# Patient Record
Sex: Male | Born: 1995 | Race: White | Hispanic: No | Marital: Married | Smoking: Never smoker
Health system: Southern US, Community
[De-identification: ages and names within clinical notes are randomized; demographics above are authoritative.]

## PROBLEM LIST (undated history)

## (undated) DIAGNOSIS — K589 Irritable bowel syndrome without diarrhea: Secondary | ICD-10-CM

## (undated) DIAGNOSIS — G35 Multiple sclerosis: Secondary | ICD-10-CM

## (undated) HISTORY — PX: TONSILLECTOMY: SUR1361

## (undated) HISTORY — PX: ADENOIDECTOMY: SUR15

## (undated) HISTORY — DX: Multiple sclerosis: G35

## (undated) HISTORY — DX: Irritable bowel syndrome, unspecified: K58.9

---
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
## 2017-03-26 IMAGING — US US SCROTUM
1 series · 14 of 25 positions shown · non-contrast
Comparison: None.

CLINICAL DATA: Nodule in the left scrotum

EXAM:
ULTRASOUND OF SCROTUM
TECHNIQUE: Complete ultrasound examination of the testicles, epididymis, and
other scrotal structures was performed.

[Series 1: us scrotum · 0.07mm/px · 14 of 73 slices shown]
[im 1/73]
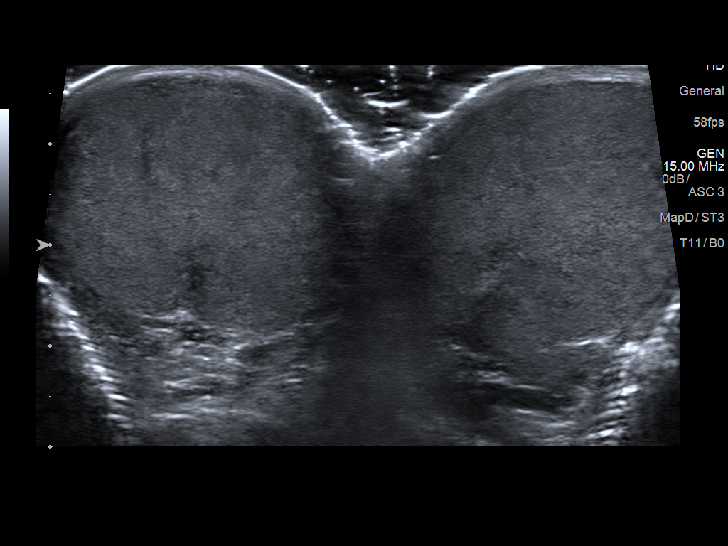
[im 7/73]
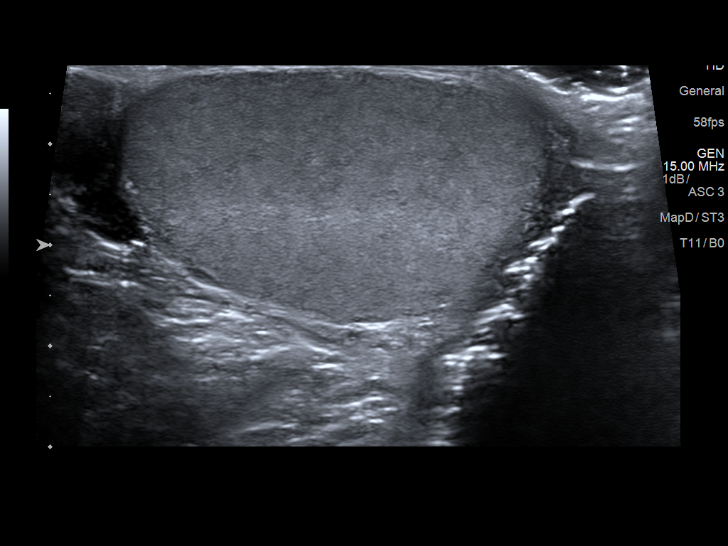
[im 13/73]
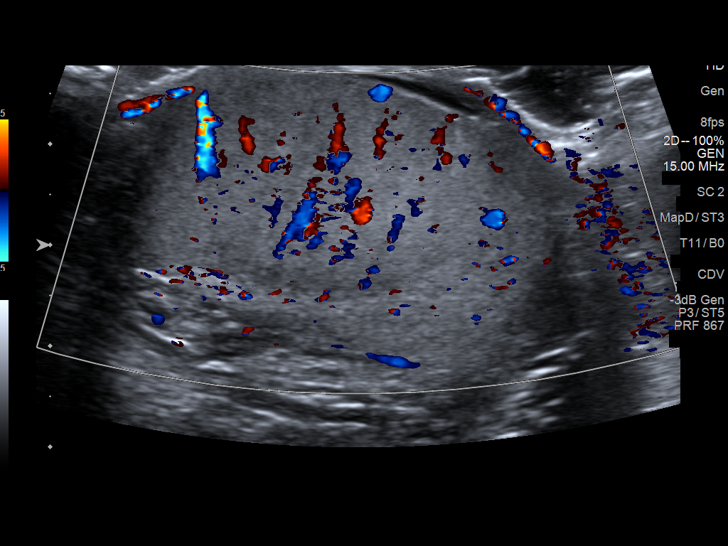
[im 19/73]
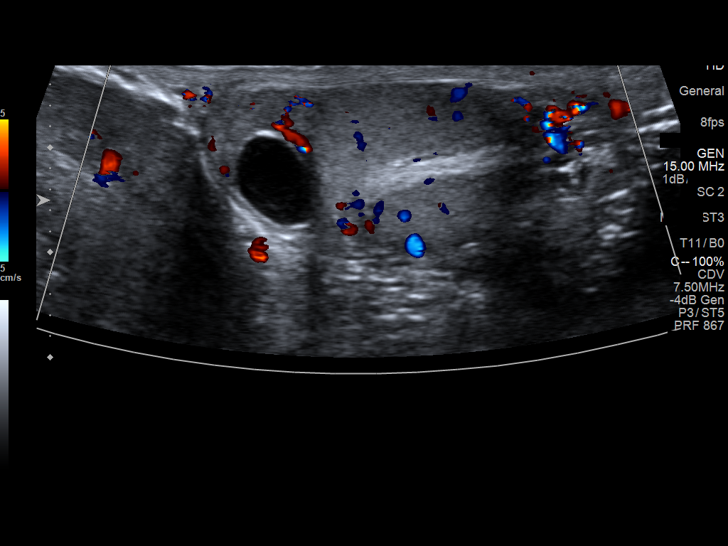
[im 25/73]
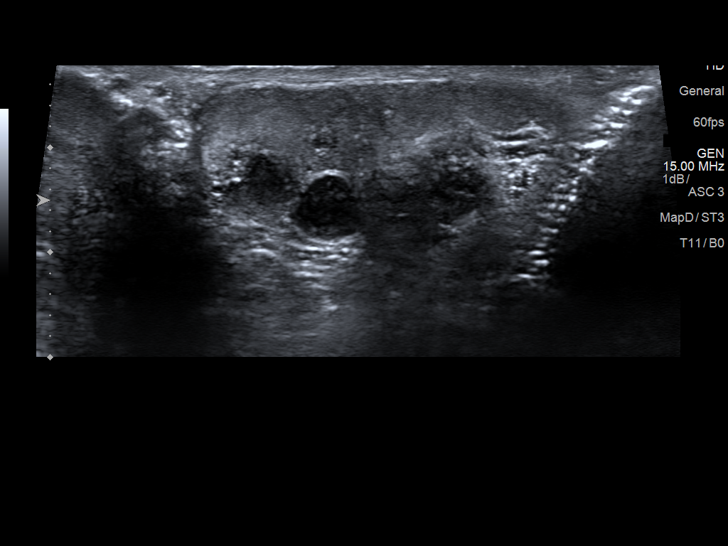
[im 28/73]
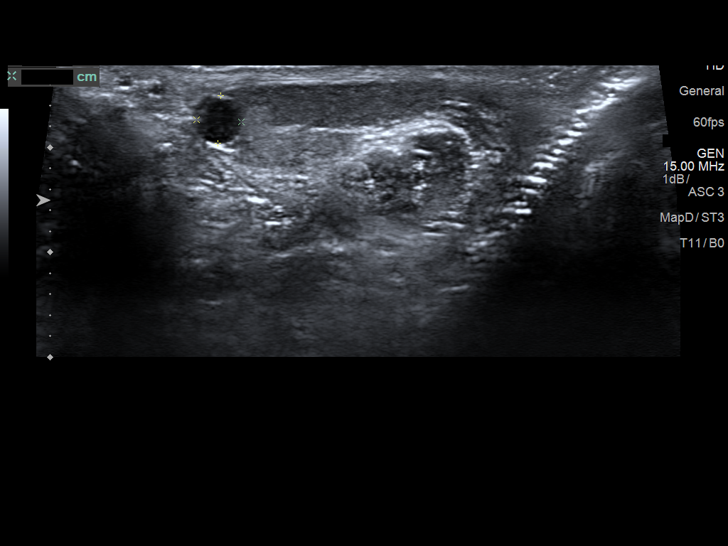
[im 34/73]
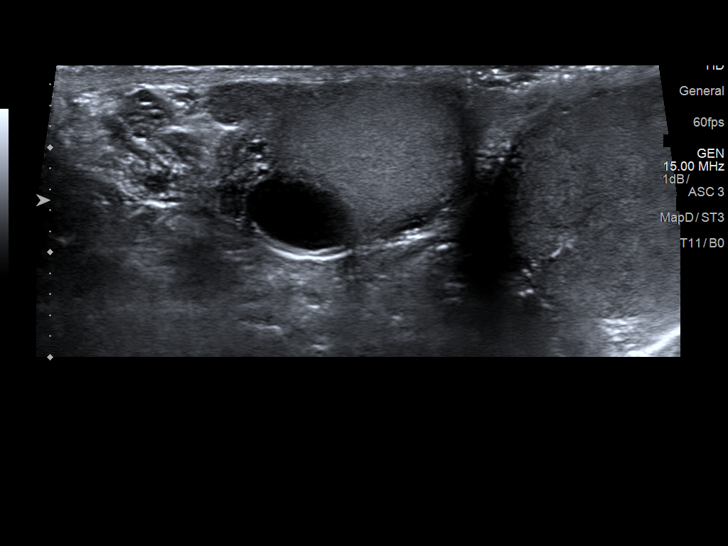
[im 40/73]
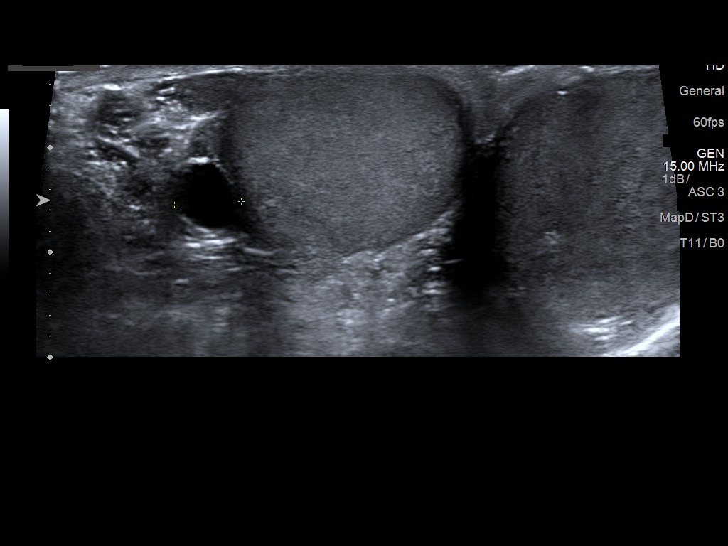
[im 46/73]
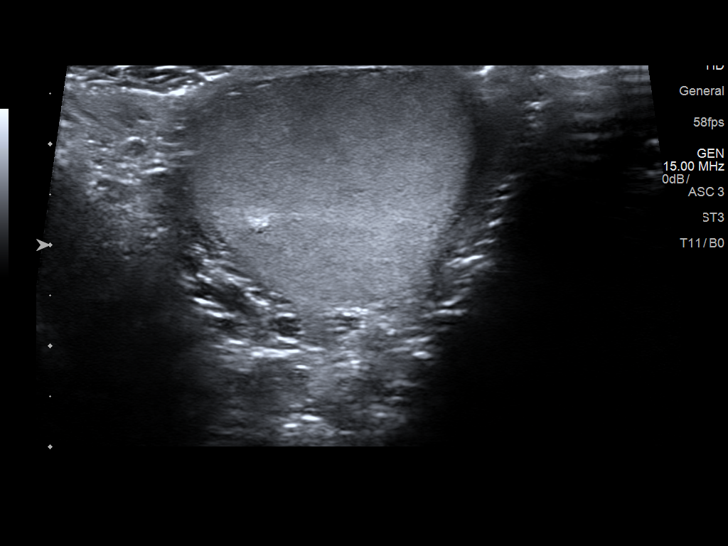
[im 49/73]
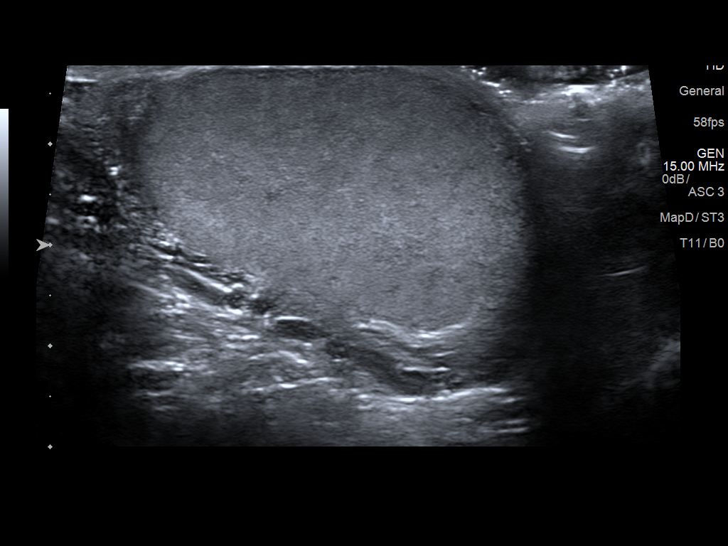
[im 55/73]
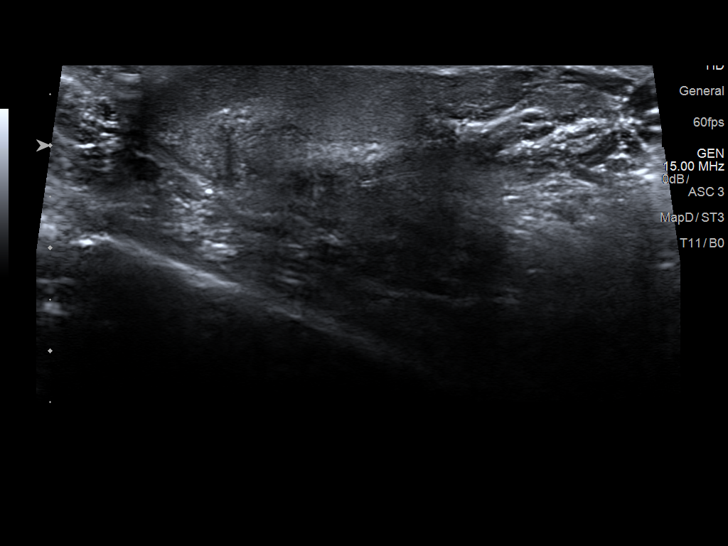
[im 61/73]
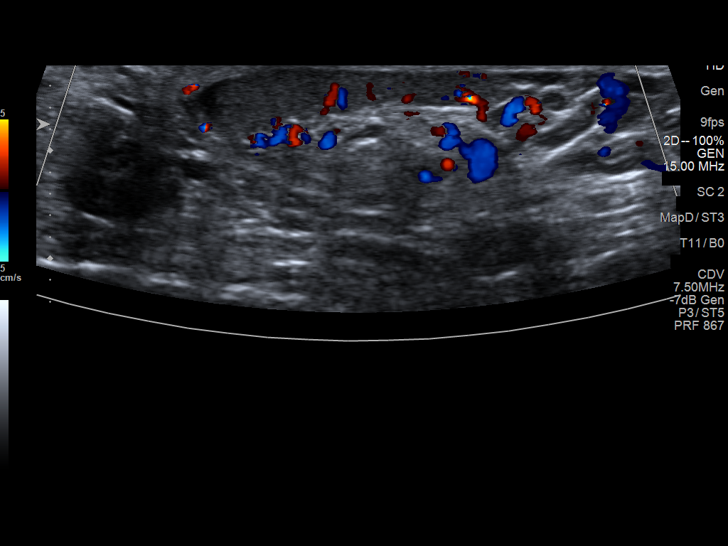
[im 67/73]
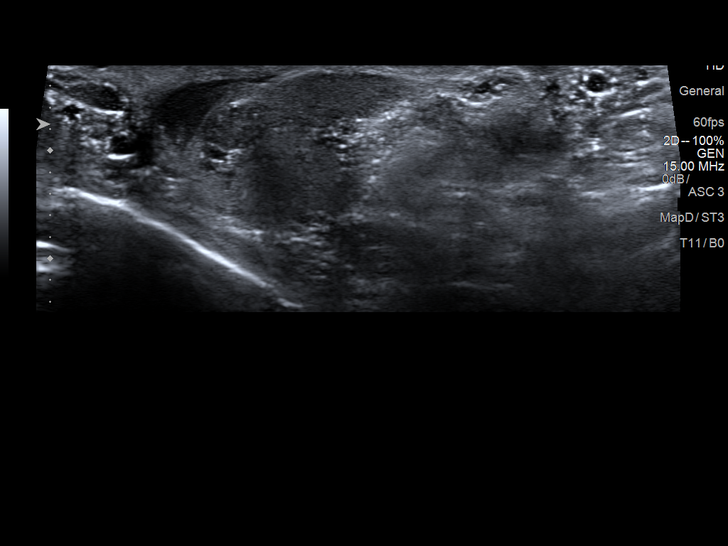
[im 73/73]
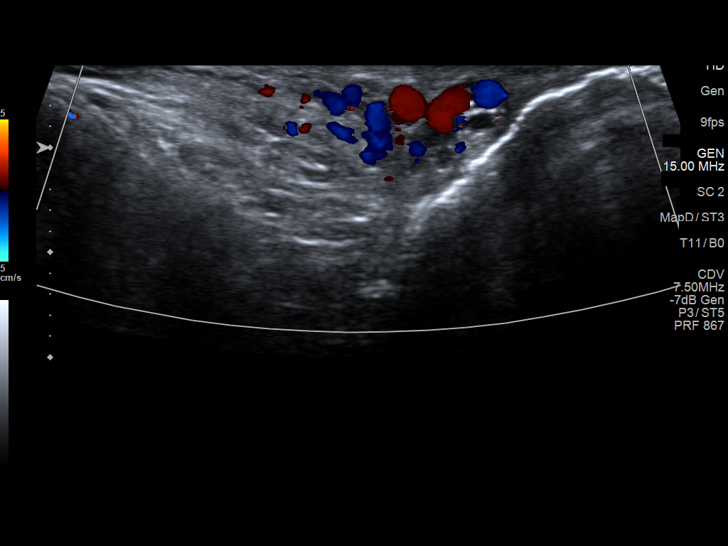

[14 of 25 positions shown; findings below may reference images not displayed]

FINDINGS: Right testicle

Measurements: 4.3 x 2.5 x 2.7 cm. No mass or microlithiasis
visualized.

Left testicle

Measurements: 4.1 x 2.5 x 2.9 cm. No mass or microlithiasis
visualized.

Right epididymis: Multiple cysts, the largest measures 0.9 x 0.7 x 1
cm.

Left epididymis:  Tiny cyst measuring 0.2 cm.

Hydrocele:  None visualized.

Varicocele:  None visualized.
IMPRESSION: 1. Negative for cystic or solid mass within the testes
2. Bilateral right greater than left epididymal cysts

## 2017-06-15 ENCOUNTER — Encounter: Payer: Self-pay | Admitting: Family Medicine

## 2017-06-15 ENCOUNTER — Ambulatory Visit (INDEPENDENT_AMBULATORY_CARE_PROVIDER_SITE_OTHER): Payer: BC Managed Care – PPO | Admitting: Family Medicine

## 2017-06-15 VITALS — BP 124/68 | HR 52 | Temp 98.7°F | Ht 71.25 in | Wt 147.0 lb

## 2017-06-15 DIAGNOSIS — Z0001 Encounter for general adult medical examination with abnormal findings: Secondary | ICD-10-CM | POA: Diagnosis not present

## 2017-06-15 DIAGNOSIS — N503 Cyst of epididymis: Secondary | ICD-10-CM

## 2017-06-15 MED ORDER — HYOSCYAMINE SULFATE 0.125 MG PO TABS: 0.1250 mg | ORAL_TABLET | ORAL | 5 refills | Status: DC | PRN

## 2017-06-15 NOTE — Patient Instructions (Addendum)
I think you are doing very well.   Glad to hear how well things are going in school  Seems like you are striking the right balance between stress/your bowels/hyoscyamine  Next time we see you - get some baseline labs  Have a great weekend!

## 2017-06-15 NOTE — Progress Notes (Addendum)
Phone: [phone]  Subjective:  Patient presents today for their annual physical. Chief complaint-noted.   See problem oriented charting- ROS- full  review of systems was completed and negative including No chest pain or shortness of breath. No headache or blurry vision.   The following were reviewed and entered/updated in epic: Past Medical History:  Diagnosis Date  . IBS (irritable colon syndrome)    Constipation predominant. Constipation- with BM abdominal pain improves. Comes in waves with higher stress. worse with less exercise. Plan: increase exercise, watch FODMAP, consider metamucil if that doesnt work   Patient Active Problem List   Diagnosis Date Noted  . IBS (irritable colon syndrome)     Priority: Medium  . Right leg pain 07/02/2014    Priority: Low   Past Surgical History:  Procedure Laterality Date  . ADENOIDECTOMY     2006  . TONSILLECTOMY      Family History  Problem Relation Age of Onset  . Healthy Mother        and father  . Hyperlipidemia Unknown        grandparent  . Hypertension Unknown        grandparent  . Diabetes Unknown        grandparent  . ADD / ADHD Brother     Medications- reviewed and updated Current Outpatient Prescriptions  Medication Sig Dispense Refill  . hyoscyamine (LEVSIN, ANASPAZ) 0.125 MG tablet Take 1 tablet (0.125 mg total) by mouth every 4 (four) hours as needed (for abdominal cramping. 4 doses a day maximum.). 90 tablet 5   No current facility-administered medications for this visit.     Allergies-reviewed and updated No Known Allergies  Social History   Social History  . Marital status: Single    Spouse name: N/A  . Number of children: N/A  . Years of education: N/A   Social History Main Topics  . Smoking status: Never Smoker  . Smokeless tobacco: Never Used  . Alcohol use No  . Drug use: No  . Sexual activity: Not Asked   Other Topics Concern  . None   Social History Narrative   Family: Single.  Not dating right now.       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 High achieving student      Hobbies: exercise- lifting at gym at least 2-3x a week, video games on xbox and PC, [place] PCP: Nena Polio, [place] in [place]    Objective: BP 124/68 (BP Location: Left Arm, Patient Position: Sitting, Cuff Size: Normal)   Pulse (!) 52   Temp 98.7 F (37.1 C) (Oral)   Ht 5' 11.25" (1.81 m)   Wt 147 lb (66.7 kg)   SpO2 98%   BMI 20.36 kg/m  Gen: NAD, resting comfortably HEENT: Mucous membranes are moist. Oropharynx normal Neck: no thyromegaly CV: RRR no murmurs rubs or gallops Lungs: CTAB no crackles, wheeze, rhonchi Abdomen: soft/nontender/nondistended/normal bowel sounds. No rebound or guarding.  Ext: no edema Skin: warm, dry Neuro: grossly normal, moves all extremities, PERRLA  Assessment/Plan:  21 y.o. male presenting for annual physical.  Health Maintenance counseling: 1. Anticipatory guidance: Patient counseled regarding regular dental exams - q6 months, eye exams - no issues with vision- goes yearly still, wearing seatbelts.  2. Risk factor reduction:  Advised patient of need for regular  exercise and diet rich and fruits and vegetables to reduce risk of heart attack and stroke. Exercise- a few lbs muscle gain with weight lifting. Diet-reasonable for college student.  Wt Readings from Last 3 Encounters:  06/15/17 147 lb (66.7 kg) (38 %, Z= -0.31)*  01/26/16 143 lb (64.9 kg) (40 %, Z= -0.26)*  11/11/15 144 lb (65.3 kg) (43 %, Z= -0.17)*  3. Immunizations/screenings/ancillary studies- already had flu shot Immunization History  Administered Date(s) Administered  . DTaP 01/29/1998, 04/09/1998, 05/27/1998, 02/28/1999, 03/18/2002  . HPV Quadrivalent 03/17/2011, 04/08/2013, 04/16/2014  . Hepatitis A, Ped/Adol-2 Dose 03/17/2011, 04/08/2013  .  Hepatitis B 14-Apr-1998, 12/28/1997, 10/19/1998  . HiB (PRP-T) 01/29/1998, 04/09/1998, 05/27/1998, 02/28/1999  . IPV 01/29/1998, 04/09/1998, 05/27/1998, 03/18/2002  . Influenza-Unspecified 08/03/2004, 07/12/2007, 07/05/2012, 06/07/2013, 06/06/2014, 04/26/2015, 06/27/2015, 05/12/2017  . MMR 12/06/1998, 03/18/2002  . Meningococcal B, OMV 11/11/2015  . Meningococcal Conjugate 01/07/2009, 04/16/2014, 12/13/2015  . Td 01/07/2009  . Tdap 01/07/2009  . Varicella 12/06/1998, 04/08/2013  4. Prostate cancer screening- no family history, start at age 9-55   5. Colon cancer screening - no family history, start at age 71-50 6. Skin cancer screening/prevention- advised regular sunscreen use. Denies worrisome, changing, or new skin lesions. Monitor back - 1 reddish 8 mm x 6 mm lesion- will look at yearly 7. Testicular cancer screening- advised monthly self exams  8. STD screening- patient opts out- abstinent. Has been screened for HIV by giving blood previously  Status of chronic or acute concerns   Screen for hyperlipidemia next year starting age 14. Likely get CBC, CMP, UA as well just for basline  Hyoscyamine 0.125 mg- takes sparingly- helping IBS cramps but takes 2 leads to constipation. Constipation has been hit or miss. Stress can cause it  Addendum: returns as noted nodule above left testicle. Feels like likely epididymal cyst but will get ultrasound to be sure.   Meds ordered this encounter  Medications  . hyoscyamine (LEVSIN, ANASPAZ) 0.125 MG tablet    Sig: Take 1 tablet (0.125 mg total) by mouth every 4 (four) hours as needed (for abdominal cramping. 4 doses a day maximum.).    Dispense:  90 tablet    Refill:  5    Return precautions advised.   Garret Reddish, MD

## 2017-06-17 ENCOUNTER — Encounter: Payer: Self-pay | Admitting: Family Medicine

## 2017-06-18 NOTE — Telephone Encounter (Signed)
Friday afternoon would be perfect- then you would have to come back for the ultrasound if that is needed on another date. Just stop by and Asher Muir can room you- once again do not have to be on the schedule for a visit- I will addend your physical.   [name]

## 2017-06-22 NOTE — Addendum Note (Signed)
Addended by: Shelva Majestic on: 06/22/2017 03:35 PM   Modules accepted: Orders

## 2017-06-26 ENCOUNTER — Telehealth: Payer: Self-pay

## 2017-06-26 NOTE — Telephone Encounter (Signed)
[facility] Calling.  Patient will be having US scrotum.  Radiology wants to know if Dr. Durene Cal wants patient to have Doppler as well?  If yes they need order [id number] placed.  If no, please call back [phone] and let them know.  (If Doppler is wanted, just place order, no need to call back).

## 2017-06-26 NOTE — Telephone Encounter (Signed)
I was not planning on doppler because not concerned about torsion

## 2017-06-27 ENCOUNTER — Telehealth: Payer: Self-pay | Admitting: Family Medicine

## 2017-06-27 NOTE — Telephone Encounter (Signed)
[facility] emailed me to inform that a doppler was needed to be added to [place] orders in for patient. [facility], [id number] is the code they gave me.

## 2017-06-27 NOTE — Telephone Encounter (Signed)
Radiology is aware of annotations.

## 2017-06-28 ENCOUNTER — Other Ambulatory Visit: Payer: Self-pay

## 2017-06-28 DIAGNOSIS — N503 Cyst of epididymis: Secondary | ICD-10-CM

## 2017-06-28 NOTE — Telephone Encounter (Signed)
Order placed as requested.

## 2017-06-29 ENCOUNTER — Ambulatory Visit (HOSPITAL_COMMUNITY)
Admission: RE | Admit: 2017-06-29 | Discharge: 2017-06-29 | Disposition: A | Discharge status: Home / Self Care | Payer: BC Managed Care – PPO | Source: Ambulatory Visit | Attending: Family Medicine | Admitting: Family Medicine

## 2017-06-29 DIAGNOSIS — N503 Cyst of epididymis: Secondary | ICD-10-CM | POA: Insufficient documentation

## 2018-09-19 ENCOUNTER — Encounter: Payer: Self-pay | Admitting: Family Medicine

## 2019-04-24 ENCOUNTER — Encounter: Payer: BC Managed Care – PPO | Admitting: Family Medicine

## 2019-05-09 ENCOUNTER — Encounter: Payer: BC Managed Care – PPO | Admitting: Family Medicine

## 2019-06-05 NOTE — Patient Instructions (Addendum)
Health Maintenance Due  Topic Date Due  . TETANUS/TDAP - today 01/08/2019  . INFLUENZA VACCINE - 2 weeks ago, updated. Thanks for getting ths 04/12/2019    Please stop by lab before you go If you do not have mychart- we will call you about results within 5 business days of [place] receiving them.  If you have mychart- we will send your results within 3 business days of [place] receiving them.  If abnormal or we want to clarify a result, we will call or mychart you to make sure you receive the message.  If you have questions or concerns or don't hear within 5-7 days, please send [place] a message or call us.

## 2019-06-05 NOTE — Progress Notes (Signed)
Phone: [phone]    Subjective:  Patient presents today for their annual physical. Chief complaint-noted.   See problem oriented charting- ROS- full  review of systems was completed and negative  except for: anxiety related to school work  The following were reviewed and entered/updated in epic: Past Medical History:  Diagnosis Date  . IBS (irritable colon syndrome)    Constipation predominant. Constipation- with BM abdominal pain improves. Comes in waves with higher stress. worse with less exercise. Plan: increase exercise, watch FODMAP, consider metamucil if that doesnt work   Patient Active Problem List   Diagnosis Date Noted  . IBS (irritable colon syndrome)     Priority: Medium  . Right leg pain 07/02/2014    Priority: Low   Past Surgical History:  Procedure Laterality Date  . ADENOIDECTOMY     2006  . TONSILLECTOMY      Family History  Problem Relation Age of Onset  . Healthy Mother        and father  . Hyperlipidemia Unknown        grandparent  . Hypertension Unknown        grandparent  . Diabetes Unknown        grandparent  . ADD / ADHD Brother     Medications- reviewed and updated Current Outpatient Medications  Medication Sig Dispense Refill  . hyoscyamine (LEVSIN) 0.125 MG tablet Take 1 tablet (0.125 mg total) by mouth every 4 (four) hours as needed (for abdominal cramping. 4 doses a day maximum.). 90 tablet 5   No current facility-administered medications for this visit.     Allergies-reviewed and updated No Known Allergies  Social History   Social History Narrative   Family: Single. Not dating right now.       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 High achieving student      Hobbies: exercise- lifting at gym at least 2-3x a week, video games on xbox and PC, [place]- Chiropractor      Prior PCP: Nena Polio, FNP [facility] in  [place]      Objective:  BP 120/70 (BP Location: Left Arm, Patient Position: Sitting, Cuff Size: Normal)   Pulse 65   Temp 98.3 F (36.8 C) (Temporal)   Ht 5' 11.25" (1.81 m)   Wt 149 lb (67.6 kg)   SpO2 98%   BMI 20.64 kg/m  Gen: NAD, resting comfortably HEENT: Mucous membranes are moist. Oropharynx normal- no canker sores today Neck: no thyromegaly CV: RRR no murmurs rubs or gallops Lungs: CTAB no crackles, wheeze, rhonchi Abdomen: soft/nontender/nondistended/normal bowel sounds. No rebound or guarding.  Ext: no edema Skin: warm, dry Neuro: grossly normal, moves all extremities, PERRLA    Assessment and Plan:  23 y.o. male presenting for annual physical.  Health Maintenance counseling: 1. Anticipatory guidance: Patient counseled regarding regular dental exams -q6 months, eye exams - no vision issues- considering an updated exam,  avoiding smoking and second hand smoke , limiting alcohol to 2 beverages per day.   2. Risk factor reduction:  Advised patient of need for regular exercise and diet rich and fruits and vegetables to reduce risk of heart attack and stroke. Exercise- likes to bike with GF- a little more sedentary due to online schooling. Diet-reasonably healthy.  Wt Readings from Last 3 Encounters:  06/06/19 149 lb (67.6 kg)  06/15/17 147 lb (66.7 kg) (  38 %, Z= -0.31)*  01/26/16 143 lb (64.9 kg) (40 %, Z= -0.26)*   * Growth percentiles are based on CDC (Boys, 2-20 Years) data.  3. Immunizations/screenings/ancillary studies-recommended Tdap and influenza vaccination today-patient agrees to Tdap but already flu vaccine a few weeks ago.   Immunization History  Administered Date(s) Administered  . DTaP 01/29/1998, 04/09/1998, 05/27/1998, 02/28/1999, 03/18/2002  . HPV Quadrivalent 03/17/2011, 04/08/2013, 04/16/2014  . Hepatitis A, Ped/Adol-2 Dose 03/17/2011, 04/08/2013  . Hepatitis B May 06, 1998, 12/28/1997, 10/19/1998  . HiB (PRP-T) 01/29/1998, 04/09/1998,  05/27/1998, 02/28/1999  . IPV 01/29/1998, 04/09/1998, 05/27/1998, 03/18/2002  . Influenza,inj,Quad PF,6+ Mos 05/23/2019  . Influenza-Unspecified 08/03/2004, 07/12/2007, 07/05/2012, 06/07/2013, 06/06/2014, 04/26/2015, 06/27/2015, 05/12/2017  . MMR 12/06/1998, 03/18/2002  . Meningococcal B, OMV 11/11/2015  . Meningococcal Conjugate 01/07/2009, 04/16/2014, 12/13/2015  . Td 01/07/2009  . Tdap 01/07/2009  . Varicella 12/06/1998, 04/08/2013   4. Prostate cancer screening-   No family history, start at age 34   5. Colon cancer screening -  no family history, start at age 19  6. Skin cancer screening/prevention- advised regular sunscreen use. Denies worrisome, changing, or new skin lesions.  Monitoring back-a reddish 8 x 6 mm lesion- stable this year  7. Testicular cancer screening- advised monthly self exams  8. STD screening- patient opts out-practicing abstinence 9.  Never smoker-   Status of chronic or acute concerns  IBS -reported good first visit here November 11, 2015- had reassuring CT scan in the past-I do not see that we have obtained records.  Worse with periods of high stress.  Symptoms resolved after bowel movement typically.  Taking Hyocyamine 0.125 mg as needed- can constipate him though  Dealing with some anxiety related to school work. He is considering going to a counselor again. He is thinking about trying someone outside of the school this time.   Doing a good job of sleep- at bed by 11 PM. Also trying to take 1 day a week to not do schoolwork  Canker sores- fluocinonide from his dentist- somewhat helpful- discussed decreasing stress. No cold sores/fever blisters.   Recommended follow up: 1 year physical  Lab/Order associations:not fasting- 1 banana   ICD-10-CM   1. Preventative health care  Z00.00 CBC    Comprehensive metabolic panel    Lipid panel  2. Screening for hyperlipidemia  Z13.220 Lipid panel  3. Irritable bowel syndrome with constipation  K58.1 CBC     Comprehensive metabolic panel   Return precautions advised.   Garret Reddish, MD

## 2019-06-06 ENCOUNTER — Other Ambulatory Visit: Payer: Self-pay

## 2019-06-06 ENCOUNTER — Ambulatory Visit (INDEPENDENT_AMBULATORY_CARE_PROVIDER_SITE_OTHER): Payer: BC Managed Care – PPO | Admitting: Family Medicine

## 2019-06-06 ENCOUNTER — Encounter: Payer: Self-pay | Admitting: Family Medicine

## 2019-06-06 VITALS — BP 120/70 | HR 65 | Temp 98.3°F | Ht 71.25 in | Wt 149.0 lb

## 2019-06-06 DIAGNOSIS — Z23 Encounter for immunization: Secondary | ICD-10-CM

## 2019-06-06 DIAGNOSIS — K581 Irritable bowel syndrome with constipation: Secondary | ICD-10-CM

## 2019-06-06 DIAGNOSIS — Z13220 Encounter for screening for lipoid disorders: Secondary | ICD-10-CM | POA: Diagnosis not present

## 2019-06-06 DIAGNOSIS — Z0000 Encounter for general adult medical examination without abnormal findings: Secondary | ICD-10-CM

## 2019-06-06 LAB — LIPID PANEL
Cholesterol: 114 mg/dL (ref 0–200)
HDL: 44.2 mg/dL (ref >39.00)
LDL Cholesterol: 59 mg/dL (ref 0–99)
NonHDL: 69.44
Total CHOL/HDL Ratio: 3
Triglycerides: 50 mg/dL (ref 0.0–149.0)
VLDL: 10 mg/dL (ref 0.0–40.0)

## 2019-06-06 LAB — CBC
HCT: 46.5 % (ref 39.0–52.0)
Hemoglobin: 15.6 g/dL (ref 13.0–17.0)
MCHC: 33.6 g/dL (ref 30.0–36.0)
MCV: 88.3 fl (ref 78.0–100.0)
Platelets: 139 10*3/uL — ABNORMAL LOW (ref 150.0–400.0)
RBC: 5.27 Mil/uL (ref 4.22–5.81)
RDW: 12.8 % (ref 11.5–15.5)
WBC: 4.5 10*3/uL (ref 4.0–10.5)

## 2019-06-06 LAB — COMPREHENSIVE METABOLIC PANEL
ALT: 12 U/L (ref 0–53)
AST: 16 U/L (ref 0–37)
Albumin: 5 g/dL (ref 3.5–5.2)
Alkaline Phosphatase: 59 U/L (ref 39–117)
BUN: 15 mg/dL (ref 6–23)
CO2: 27 mEq/L (ref 19–32)
Calcium: 10 mg/dL (ref 8.4–10.5)
Chloride: 103 mEq/L (ref 96–112)
Creatinine, Ser: 0.9 mg/dL (ref 0.40–1.50)
GFR: 105.99 mL/min (ref >60.00)
Glucose, Bld: 96 mg/dL (ref 70–99)
Potassium: 4.2 mEq/L (ref 3.5–5.1)
Sodium: 139 mEq/L (ref 135–145)
Total Bilirubin: 2.3 mg/dL — ABNORMAL HIGH (ref 0.2–1.2)
Total Protein: 6.9 g/dL (ref 6.0–8.3)

## 2019-06-06 MED ORDER — HYOSCYAMINE SULFATE 0.125 MG PO TABS: 0.1250 mg | ORAL_TABLET | ORAL | 5 refills | Status: DC | PRN

## 2019-06-06 NOTE — Addendum Note (Signed)
Addended by: Jasper Loser on: 06/06/2019 09:52 AM   Modules accepted: Orders

## 2019-06-09 ENCOUNTER — Encounter: Payer: Self-pay | Admitting: Family Medicine

## 2020-05-07 NOTE — Progress Notes (Signed)
Phone [phone] Virtual visit via Video note   Subjective:  Chief complaint: Chief Complaint  Patient presents with  . Dizziness    2 episodes , no syncope   . Abdominal Pain    onset: gradual     This visit type was conducted due to national recommendations for restrictions regarding the COVID-19 Pandemic (e.g. social distancing).  This format is felt to be most appropriate for this patient at this time balancing risks to patient and risks to population by having him in for in person visit.  No physical exam was performed (except for noted visual exam or audio findings with Telehealth visits).    Our team/I connected with Ernestine Conrad Kalbfleisch at 11:00 AM EDT by a video enabled telemedicine application (doxy.me or caregility through epic) and verified that I am speaking with the correct person using two identifiers.  Location patient: [place] Location provider: [facility], office Persons participating in the virtual visit:  patient  Our team/I discussed the limitations of evaluation and management by telemedicine and the availability of in person appointments. In light of current covid-19 pandemic, patient also understands that we are trying to protect them by minimizing in office contact if at all possible.  The patient expressed consent for telemedicine visit and agreed to proceed. Patient understands insurance will be billed.   Past Medical History-  Patient Active Problem List   Diagnosis Date Noted  . IBS (irritable colon syndrome)     Priority: Medium  . Right leg pain 07/02/2014    Priority: Low    Medications- reviewed and updated Current Outpatient Medications  Medication Sig Dispense Refill  . hyoscyamine (LEVSIN) 0.125 MG tablet Take 1 tablet (0.125 mg total) by mouth every 4 (four) hours as needed (for abdominal cramping. 4 doses a day maximum.). 90 tablet 5  . meclizine (ANTIVERT) 12.5 MG tablet Take 1-2 tablets (12.5-25 mg total) by mouth 3 (three) times  daily as needed for dizziness. /vertigo 40 tablet 0   No current facility-administered medications for this visit.     Objective:  no self reported vitals Gen: NAD, resting comfortably Lungs: nonlabored, normal respiratory rate  Skin: appears dry, no obvious rash     Assessment and Plan   # vertigo S:He has been having some bouts of dizziness that lead to nausea. Stated 3 weeks ago right before school sarted. Woke up in the morning and felt like there was fluid in the right ear. That lasted 3-4 days and then got better - perhaps felt slightly off balance in coming weeks. Yesterday woke up with very similar symptoms. Today has improved more rapidly.   With first episode if tilted his head a certain way felt like the room was moving. Occasional slight headache. Has noted perhaps mild blurry vision with running. No hearing loss or ringing in the ears. No reported stroke like symptoms (other than possibly vertigo but that sounds more consistent with BPPV)  Tried taking an antihistamine intermittently. Mom (an NP) recommended taking it daily but has not tried that yet. Is high stress in graduate school.   Tried epley maneuvers first week and not sure how much it helped- he had to stop due to nausea A/P: Patient with vertigo and nausea typically associated with head movement concerning for BPPV.  No tinnitus or hearing loss-doubt Mnire's.  Minimal risk factors for stroke and I think this would be unlikely.  He did not note improvement with Epley maneuvers but that was due to nausea-I gave him  information on the following from YouTube "Alpha Gula, MD Vertigo Treatment Oct 11 [email or web address]".  I want him to try these exercises if he has any recurrence of issues.  Also want him to try meclizine.  If not improving with either of these 100s back out-would consider vestibular rehab  He also feels like these issues have flared his IBS up slightly-asks about using  high-dose cream but it can cause some constipation-recommended first treating BPPV but he can use a hyoscyamine if needed.   Recommended follow up: As needed if symptoms worsen or fail to improve   Lab/Order associations:   ICD-10-CM   1. Benign paroxysmal positional vertigo, unspecified laterality  H81.10   2. Irritable bowel syndrome with constipation  K58.1     Meds ordered this encounter  Medications  . meclizine (ANTIVERT) 12.5 MG tablet    Sig: Take 1-2 tablets (12.5-25 mg total) by mouth 3 (three) times daily as needed for dizziness. Tristan Schroeder    Dispense:  40 tablet    Refill:  0   Return precautions advised.  [name], MD

## 2020-05-08 ENCOUNTER — Telehealth (INDEPENDENT_AMBULATORY_CARE_PROVIDER_SITE_OTHER): Payer: BC Managed Care – PPO | Admitting: Family Medicine

## 2020-05-08 ENCOUNTER — Encounter: Payer: Self-pay | Admitting: Family Medicine

## 2020-05-08 DIAGNOSIS — K581 Irritable bowel syndrome with constipation: Secondary | ICD-10-CM

## 2020-05-08 DIAGNOSIS — H8110 Benign paroxysmal vertigo, unspecified ear: Secondary | ICD-10-CM | POA: Diagnosis not present

## 2020-05-08 MED ORDER — MECLIZINE HCL 12.5 MG PO TABS: 12.5000 mg | ORAL_TABLET | Freq: Three times a day (TID) | ORAL | 0 refills | Status: DC | PRN

## 2020-05-10 ENCOUNTER — Telehealth: Payer: Self-pay | Admitting: Family Medicine

## 2020-05-10 NOTE — Telephone Encounter (Signed)
LVM for patient to give the office a call back

## 2020-05-10 NOTE — Telephone Encounter (Signed)
Please call and schedule pt OV.

## 2020-05-10 NOTE — Telephone Encounter (Signed)
Patient had a visit with Dr.Hunter on Saturday.

## 2020-05-10 NOTE — Telephone Encounter (Signed)
Nurse Assessment Nurse: Kizzie Bane, RN, Marylene Land Date/Time Albert Edwards Time): 05/07/2020 9:44:04 AM Confirm and document reason for call. If symptomatic, describe symptoms. ---Caller states he has had some dizzy spells recently. This morning it happened again with nausea. It comes in waves Has the patient had close contact with a person known or suspected to have the novel coronavirus illness OR traveled / lives in area with major community spread (including international travel) in the last 14 days from the onset of symptoms? * If Asymptomatic, screen for exposure and travel within the last 14 days. ---No Does the patient have any new or worsening symptoms? ---Yes Will a triage be completed? ---Yes Related visit to physician within the last 2 weeks? ---No Does the PT have any chronic conditions? (i.e. diabetes, asthma, this includes High risk factors for pregnancy, etc.) ---No Is this a behavioral health or substance abuse call? ---No Guidelines Guideline Title Affirmed Question Affirmed Notes Nurse Date/Time (Eastern Time) Dizziness - Vertigo [1] MODERATE dizziness (e.g., vertigo; feels very unsteady, interferes with normal activities) AND [2] has NOT been evaluated by physician for this Kizzie Bane, RN, Marylene Land 05/07/2020 9:46:08 AM Disp. Time (Eastern Time) Disposition Final UserPLEASE NOTE: All timestamps contained within this report are represented as [place] Standard Time. CONFIDENTIALTY NOTICE: This fax transmission is intended only for the addressee. It contains information that is legally privileged, confidential or otherwise protected from use or disclosure. If you are not the intended recipient, you are strictly prohibited from reviewing, disclosing, copying using or disseminating any of this information or taking any action in reliance on or regarding this information. If you have received this fax in error, please notify us immediately by telephone so that we can arrange for its return  to [place]. Phone: [phone], Toll-Free: [phone], Fax: [phone] Page: 2 of 2 Call Id: [id number] 05/07/2020 9:49:09 AM See PCP within 24 Hours Yes Kizzie Bane, RN, Rosalyn Charters Disagree/Comply Comply Caller Understands Yes PreDisposition Did not know what to do Care Advice Given Per Guideline SEE PCP WITHIN 24 HOURS: * IF OFFICE WILL BE OPEN: You need to be examined within the next 24 hours. Call your doctor (or NP/PA) when the office opens and make an appointment. CALL BACK IF: * Severe headache occurs * Weakness develops in an arm or leg * Unable to walk without falling * You become worse. CARE ADVICE given per Dizziness - Vertigo (Adult) guideline. Referrals REFERRED TO PCP OFFICE

## 2020-05-23 ENCOUNTER — Encounter: Payer: Self-pay | Admitting: Family Medicine

## 2020-05-23 DIAGNOSIS — H5460 Unqualified visual loss, one eye, unspecified: Secondary | ICD-10-CM

## 2020-05-28 ENCOUNTER — Other Ambulatory Visit: Payer: BC Managed Care – PPO

## 2020-05-28 ENCOUNTER — Ambulatory Visit
Admission: RE | Admit: 2020-05-28 | Discharge: 2020-05-28 | Disposition: A | Discharge status: Home / Self Care | Payer: BC Managed Care – PPO | Source: Ambulatory Visit | Attending: Family Medicine | Admitting: Family Medicine

## 2020-05-28 ENCOUNTER — Encounter: Payer: Self-pay | Admitting: Family Medicine

## 2020-05-28 ENCOUNTER — Encounter (HOSPITAL_COMMUNITY): Payer: Self-pay | Admitting: Emergency Medicine

## 2020-05-28 ENCOUNTER — Other Ambulatory Visit: Payer: Self-pay

## 2020-05-28 ENCOUNTER — Inpatient Hospital Stay (HOSPITAL_COMMUNITY)
Admission: EM | Admit: 2020-05-28 | Discharge: 2020-05-31 | DRG: 060 | Disposition: A | Discharge status: Home / Self Care | Payer: BC Managed Care – PPO | Attending: Internal Medicine | Admitting: Internal Medicine

## 2020-05-28 DIAGNOSIS — G35 Multiple sclerosis: Secondary | ICD-10-CM | POA: Diagnosis not present

## 2020-05-28 DIAGNOSIS — Z79899 Other long term (current) drug therapy: Secondary | ICD-10-CM

## 2020-05-28 DIAGNOSIS — H538 Other visual disturbances: Secondary | ICD-10-CM | POA: Diagnosis present

## 2020-05-28 DIAGNOSIS — H5460 Unqualified visual loss, one eye, unspecified: Secondary | ICD-10-CM

## 2020-05-28 DIAGNOSIS — Z20822 Contact with and (suspected) exposure to covid-19: Secondary | ICD-10-CM | POA: Diagnosis present

## 2020-05-28 DIAGNOSIS — G35A Relapsing-remitting multiple sclerosis: Secondary | ICD-10-CM | POA: Diagnosis present

## 2020-05-28 DIAGNOSIS — K589 Irritable bowel syndrome without diarrhea: Secondary | ICD-10-CM | POA: Diagnosis present

## 2020-05-28 LAB — CBC
HCT: 49.1 % (ref 39.0–52.0)
Hemoglobin: 16.6 g/dL (ref 13.0–17.0)
MCH: 29.4 pg (ref 26.0–34.0)
MCHC: 33.8 g/dL (ref 30.0–36.0)
MCV: 87.1 fL (ref 80.0–100.0)
Platelets: 190 10*3/uL (ref 150–400)
RBC: 5.64 MIL/uL (ref 4.22–5.81)
RDW: 12.3 % (ref 11.5–15.5)
WBC: 6.7 10*3/uL (ref 4.0–10.5)
nRBC: 0 % (ref 0.0–0.2)

## 2020-05-28 LAB — BASIC METABOLIC PANEL
Anion gap: 10 (ref 5–15)
BUN: 13 mg/dL (ref 6–20)
CO2: 27 mmol/L (ref 22–32)
Calcium: 9.9 mg/dL (ref 8.9–10.3)
Chloride: 103 mmol/L (ref 98–111)
Creatinine, Ser: 1.03 mg/dL (ref 0.61–1.24)
GFR calc Af Amer: >60 mL/min (ref >60)
GFR calc non Af Amer: >60 mL/min (ref >60)
Glucose, Bld: 100 mg/dL — ABNORMAL HIGH (ref 70–99)
Potassium: 3.7 mmol/L (ref 3.5–5.1)
Sodium: 140 mmol/L (ref 135–145)

## 2020-05-28 IMAGING — MR MR HEAD WO/W CM
12 series · 48 of 48 positions shown · IV contrast (14ml Multihance)
Comparison: None.

CLINICAL DATA: Monocular vision loss on the right with exertion.

EXAM:
MRI HEAD WITHOUT AND WITH CONTRAST
TECHNIQUE: Multiplanar, multiecho pulse sequences of the brain and surrounding
structures were obtained without and with intravenous contrast.
CONTRAST:  15mL MULTIHANCE GADOBENATE DIMEGLUMINE 529 MG/ML IV SOLN

[Series 5: T1 · sagittal · 4.0mm · 0.94mm/px · 1 of 31 slices shown (1 of 3)]
[im 1/31]
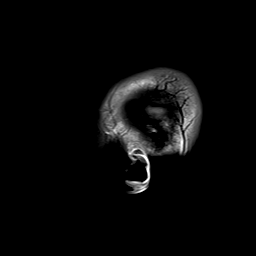

[Series 6: DWI · axial · 3.0mm · 1.44mm/px · z∈[-71,+83]mm · 6 of 96 slices shown (1 of 4)]
[im 1/96]
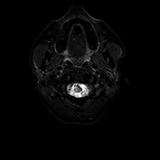
[im 20/96]
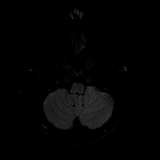
[im 39/96]
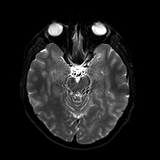
[im 58/96]
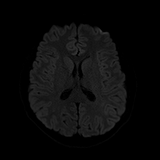
[im 77/96]
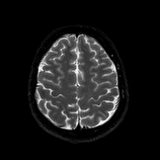
[im 96/96]
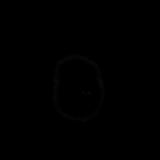

[Series 7: DWI · axial · 3.0mm · 1.44mm/px · z∈[-71,+83]mm · 3 of 48 slices shown (2 of 4)]
[im 1/48]
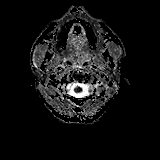
[im 24/48]
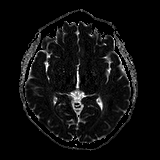
[im 48/48]
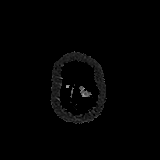

[Series 8: DWI · coronal · 5.0mm · 1.44mm/px · 4 of 66 slices shown (3 of 4)]
[im 1/66]
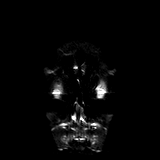
[im 22/66]
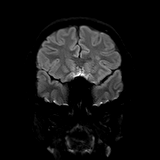
[im 44/66]
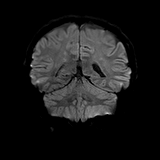
[im 66/66]
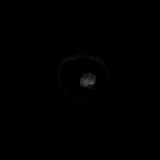

[Series 9: DWI · coronal · 5.0mm · 1.44mm/px · 2 of 33 slices shown (4 of 4)]
[im 1/33]
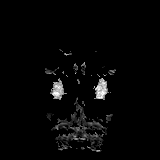
[im 33/33]
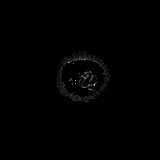

[Series 10: T2 · axial · 4.0mm · 0.36mm/px · z∈[-67,+88]mm · 2 of 31 slices shown]
[im 1/31]
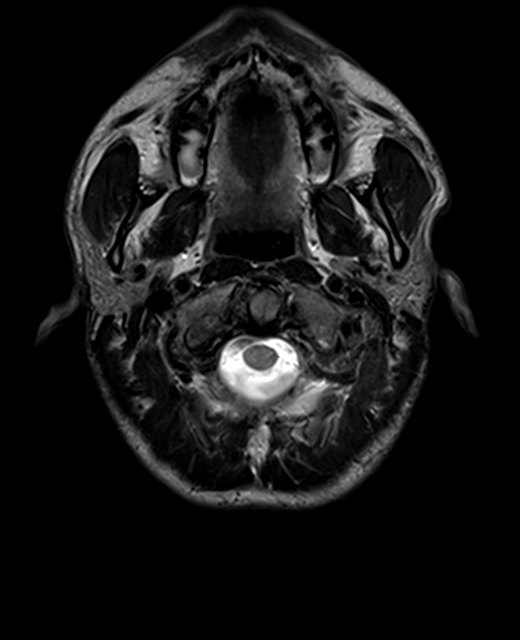
[im 31/31]
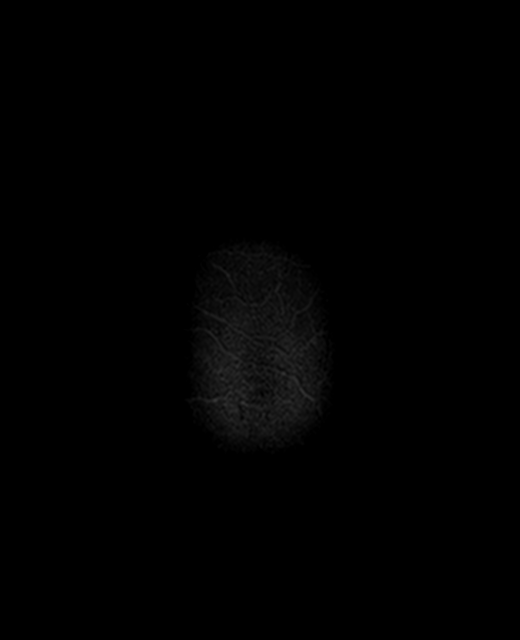

[Series 11: FLAIR · axial · 3.0mm · 0.72mm/px · z∈[-64,+85]mm · 2 of 26 slices shown]
[im 1/26]
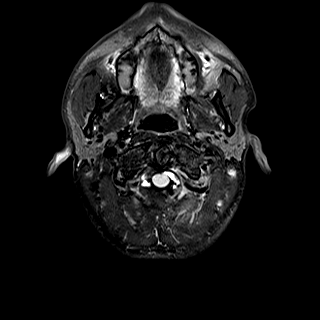
[im 26/26]
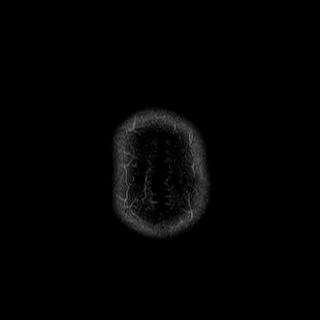

[Series 13: swi_images · axial · 1.5mm · 0.90mm/px · z∈[-60,+81]mm · 6 of 96 slices shown]
[im 1/96]
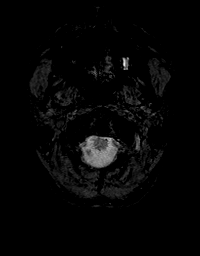
[im 20/96]
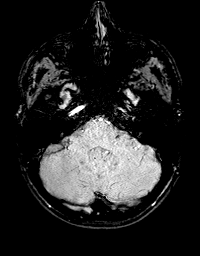
[im 39/96]
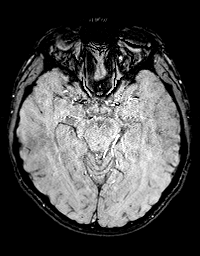
[im 58/96]
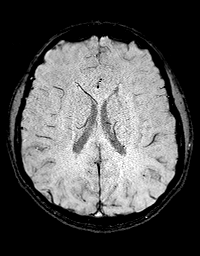
[im 77/96]
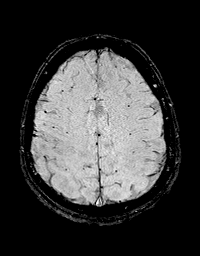
[im 96/96]
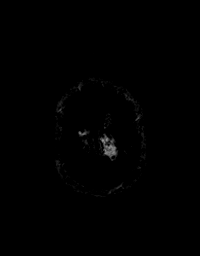

[Series 14: T1 · axial · 1.0mm · 0.94mm/px · z∈[-80,+76]mm · 9 of 160 slices shown (2 of 3)]
[im 1/160]
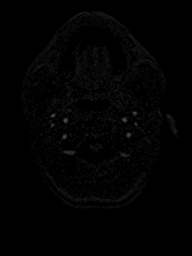
[im 20/160]
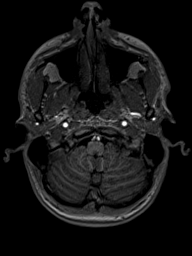
[im 40/160]
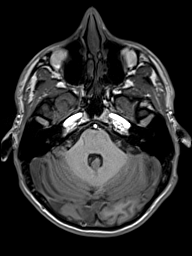
[im 60/160]
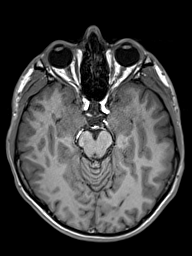
[im 80/160]
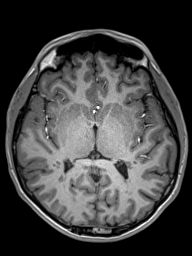
[im 100/160]
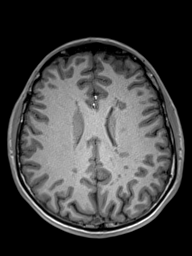
[im 120/160]
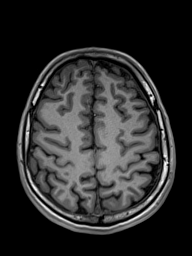
[im 140/160]
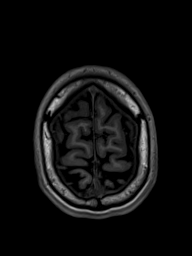
[im 160/160]
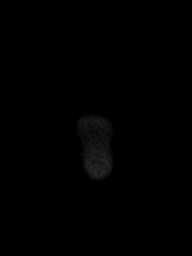

[Series 15: T2 post-contrast · coronal · 4.5mm · 0.36mm/px · 2 of 32 slices shown]
[im 1/32]
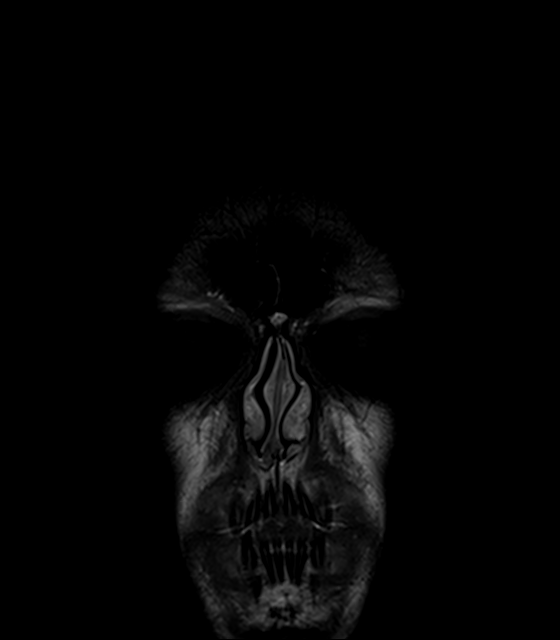
[im 32/32]
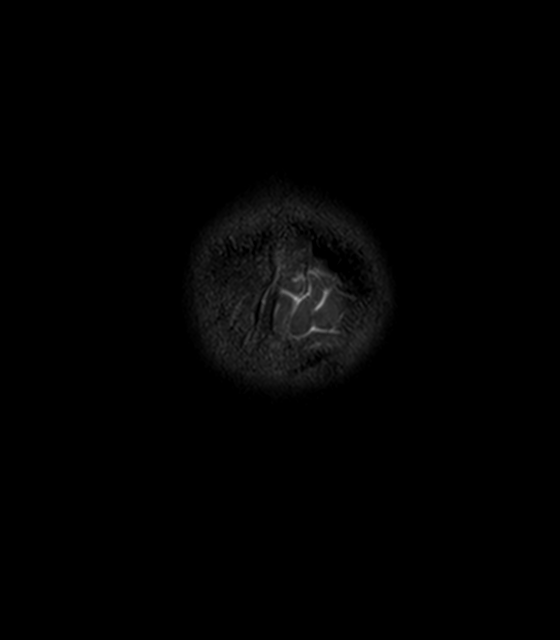

[Series 16: T1 · axial · 1.0mm · 0.94mm/px · z∈[-80,+76]mm · 9 of 160 slices shown (3 of 3)]
[im 1/160]
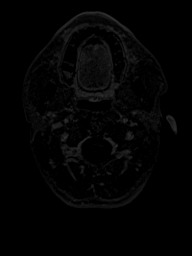
[im 20/160]
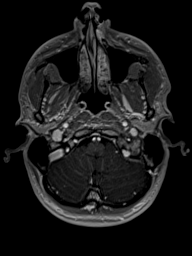
[im 40/160]
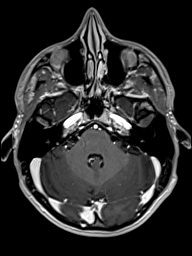
[im 60/160]
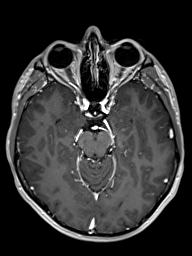
[im 80/160]
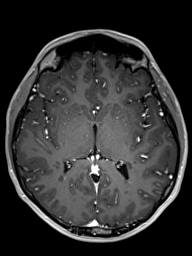
[im 100/160]
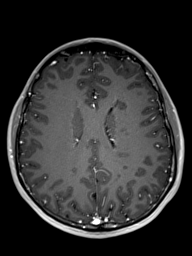
[im 120/160]
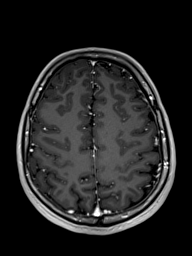
[im 140/160]
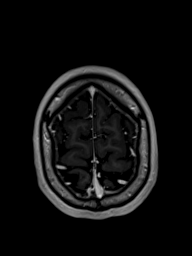
[im 160/160]
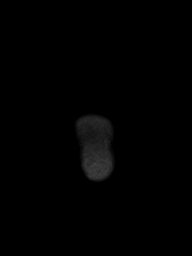

[Series 17: T1 post-contrast · coronal · 4.5mm · 0.72mm/px · 2 of 32 slices shown]
[im 1/32]
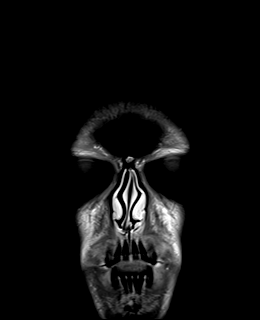
[im 32/32]
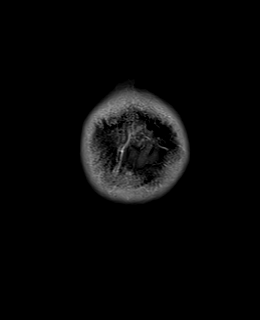

[48 of 48 positions shown; findings below may reference images not displayed]

FINDINGS: Brain: There are numerous foci of abnormal T2 and FLAIR signal
within the hemispheric white matter, with a distribution and
morphology highly suggestive of multiple sclerosis. No lesion is
seen affecting the brainstem, there is minor involvement of the
cerebellum in the region of the left middle cerebellar peduncle.
Cerebral hemispheres show approximately 20-25 lesions within each
hemisphere. None show true restricted diffusion. There is abnormal
enhancement associated with a left posterior frontal deep white
matter lesion. No evidence of mass, hemorrhage, hydrocephalus or
extra-axial collection.

Vascular: Major vessels at the base of the brain show flow.

Skull and upper cervical spine: Negative

Sinuses/Orbits: Sinuses are clear. No orbital pathology is seen.
Dedicated orbital exam was not ordered or performed, but I do not
see evidence of optic neuritis. Both globes appear normal. Orbital
apices appear normal.

Other: None
IMPRESSION: Numerous foci of abnormal T2 and FLAIR signal within the cerebral
hemispheric white matter, with a distribution and morphology highly
suggestive of multiple sclerosis. Single left posterior frontal
white matter lesion shows contrast enhancement, indicating active
disease.

## 2020-05-28 MED ORDER — GADOBENATE DIMEGLUMINE 529 MG/ML IV SOLN
15.0000 mL | Freq: Once | INTRAVENOUS | Status: AC | PRN
  Administered 2020-05-28: 15 mL via INTRAVENOUS

## 2020-05-28 NOTE — ED Triage Notes (Signed)
Pt reports he had an outpatient MRI for vision issues, and he was diagnosed with new onset MS. Pt states he was sent for IV steroids.

## 2020-05-29 ENCOUNTER — Encounter (HOSPITAL_COMMUNITY): Payer: Self-pay | Admitting: Internal Medicine

## 2020-05-29 DIAGNOSIS — K589 Irritable bowel syndrome without diarrhea: Secondary | ICD-10-CM | POA: Diagnosis present

## 2020-05-29 DIAGNOSIS — G35A Relapsing-remitting multiple sclerosis: Secondary | ICD-10-CM | POA: Diagnosis present

## 2020-05-29 DIAGNOSIS — Z20822 Contact with and (suspected) exposure to covid-19: Secondary | ICD-10-CM | POA: Diagnosis present

## 2020-05-29 DIAGNOSIS — G35 Multiple sclerosis: Secondary | ICD-10-CM | POA: Diagnosis present

## 2020-05-29 DIAGNOSIS — Z8719 Personal history of other diseases of the digestive system: Secondary | ICD-10-CM

## 2020-05-29 DIAGNOSIS — Z79899 Other long term (current) drug therapy: Secondary | ICD-10-CM | POA: Diagnosis not present

## 2020-05-29 DIAGNOSIS — H538 Other visual disturbances: Secondary | ICD-10-CM | POA: Diagnosis present

## 2020-05-29 LAB — VITAMIN D 25 HYDROXY (VIT D DEFICIENCY, FRACTURES): Vit D, 25-Hydroxy: 38.47 ng/mL (ref 30–100)

## 2020-05-29 LAB — SARS CORONAVIRUS 2 BY RT PCR (HOSPITAL ORDER, PERFORMED IN ~~LOC~~ HOSPITAL LAB): SARS Coronavirus 2: NEGATIVE

## 2020-05-29 LAB — HIV ANTIBODY (ROUTINE TESTING W REFLEX): HIV Screen 4th Generation wRfx: NONREACTIVE

## 2020-05-29 LAB — VITAMIN B12: Vitamin B-12: 324 pg/mL (ref 180–914)

## 2020-05-29 MED ORDER — ONDANSETRON HCL 4 MG/2ML IJ SOLN: 4.0000 mg | Freq: Four times a day (QID) | INTRAMUSCULAR | Status: DC | PRN

## 2020-05-29 MED ORDER — SODIUM CHLORIDE 0.9 % IV SOLN
1000.0000 mg | Freq: Once | INTRAVENOUS | Status: AC
  Administered 2020-05-29: 1000 mg via INTRAVENOUS
  Filled 2020-05-29: qty 8

## 2020-05-29 MED ORDER — ZOLPIDEM TARTRATE 5 MG PO TABS: 5.0000 mg | ORAL_TABLET | Freq: Every evening | ORAL | Status: DC | PRN

## 2020-05-29 MED ORDER — SODIUM CHLORIDE 0.9 % IV SOLN
1000.0000 mg | INTRAVENOUS | Status: DC
  Filled 2020-05-29: qty 8

## 2020-05-29 MED ORDER — ENOXAPARIN SODIUM 40 MG/0.4ML ~~LOC~~ SOLN
40.0000 mg | Freq: Every day | SUBCUTANEOUS | Status: DC
  Administered 2020-05-29 – 2020-05-30 (×2): 40 mg via SUBCUTANEOUS
  Filled 2020-05-29 (×3): qty 0.4

## 2020-05-29 MED ORDER — ONDANSETRON HCL 4 MG PO TABS: 4.0000 mg | ORAL_TABLET | Freq: Four times a day (QID) | ORAL | Status: DC | PRN

## 2020-05-29 MED ORDER — PANTOPRAZOLE SODIUM 40 MG PO TBEC
40.0000 mg | DELAYED_RELEASE_TABLET | Freq: Every day | ORAL | Status: DC
  Administered 2020-05-29 – 2020-05-31 (×3): 40 mg via ORAL
  Filled 2020-05-29 (×3): qty 1

## 2020-05-29 MED ORDER — ALBUTEROL SULFATE (2.5 MG/3ML) 0.083% IN NEBU: 2.5000 mg | INHALATION_SOLUTION | Freq: Four times a day (QID) | RESPIRATORY_TRACT | Status: DC | PRN

## 2020-05-29 MED ORDER — ACETAMINOPHEN 325 MG PO TABS: 650.0000 mg | ORAL_TABLET | Freq: Four times a day (QID) | ORAL | Status: DC | PRN

## 2020-05-29 MED ORDER — ACETAMINOPHEN 650 MG RE SUPP: 650.0000 mg | Freq: Four times a day (QID) | RECTAL | Status: DC | PRN

## 2020-05-29 MED ORDER — SODIUM CHLORIDE 0.9% FLUSH
3.0000 mL | Freq: Two times a day (BID) | INTRAVENOUS | Status: DC
  Administered 2020-05-29 – 2020-05-30 (×4): 3 mL via INTRAVENOUS

## 2020-05-29 MED ORDER — LORATADINE 10 MG PO TABS
10.0000 mg | ORAL_TABLET | Freq: Every day | ORAL | Status: DC
  Administered 2020-05-29 – 2020-05-31 (×3): 10 mg via ORAL
  Filled 2020-05-29 (×4): qty 1

## 2020-05-29 MED ORDER — SODIUM CHLORIDE 0.9 % IV SOLN
1000.0000 mg | INTRAVENOUS | Status: AC
  Administered 2020-05-30 – 2020-05-31 (×2): 1000 mg via INTRAVENOUS
  Filled 2020-05-29 (×2): qty 8

## 2020-05-29 NOTE — Consult Note (Signed)
NEURO HOSPITALIST CONSULT NOTE   Requestig physician: Ralph Leyden PA   Reason for Consult:  Multiple Sclerosis    History obtained from:  Patient and Chart    HPI:                                                                                                                                          Albert Edwards is an 24 y.o. male presenting after outpatient MRI identified Multiple Sclerosis. The patient reports that over the past month he has experienced episodes of dizziness and blurry vision in his right eye. He reports that prior to this month he has never had dizziness, blurred vision, loss of vision, numbness, tingling or weakness. Only past medical history is IBS, which he notices comes on when he is stressed at school.   [place] graduated this past May with his undergraduate degree and is currently attending [employer] for a Master's Degree in Engineering. He is engaged to be married and shares that his fiancee works in Photographer. He has fraternal twin brother and reports his twin brother has never had similar symptoms of dizziness or blurred vision. He has two additional brothers. His mother is at bedside and she is a family Publishing rights manager. The patient and his mother report no family history of autoimmune diseases. They do report a significant history of B12 deficiency in their family, his father developed numbness in his hands and had to stop working as a Education officer, community for a period of time.   He reports that his symptoms first started in the first week of August. He felt dizzy as if the room was spinning for 3-4 days and the symptoms were worse if he tilted his head backwards. He did not see a medical provider during that time. Then on 05/07/20 he had one day of dizziness accompanied by nausea. He said it was a general feeling of dizziness, not as if the room was spinning and not made worse by tilting his head backwards.    Over the past month he has experienced  blurred vision in his right eye with activity. He says if he is running or even on a brisk walk that his right eye will become extremely blurry to the point he can barely see out of it. He describes it as a blurry lens right over the center of his eye. He has not noticed the blurriness with hot showers. He does not remember every having vision trouble prior to this month. He is sure it is only the right eye and shares has covered his right eye and been able to see clearly out of the left during episodes.   Past Medical History:  Diagnosis Date  . IBS (irritable colon syndrome)    Constipation predominant. Constipation- with  BM abdominal pain improves. Comes in waves with higher stress. worse with less exercise. Plan: increase exercise, watch FODMAP, consider metamucil if that doesnt work    Past Surgical History:  Procedure Laterality Date  . ADENOIDECTOMY     2006  . TONSILLECTOMY      Family History  Problem Relation Age of Onset  . Healthy Mother        and father  . Hyperlipidemia Other        grandparent  . Hypertension Other        grandparent  . Diabetes Other        grandparent  . ADD / ADHD Brother               Family History: Significant B12 deficiency causing hand numbness in his father    Social History:  reports that he has never smoked. He has never used smokeless tobacco. He reports that he does not drink alcohol and does not use drugs.  No Known Allergies  MEDICATIONS:                                                                                                                     I have reviewed the patient's current medications.   ROS:                                                                                                                                       History obtained from the patient  General ROS: negative for - chills, fatigue, fever, night sweats, weight gain or weight loss Psychological ROS: negative for - behavioral disorder,  hallucinations, memory difficulties, mood swings or suicidal ideation Ophthalmic ROS: negative for -  double vision, eye pain or loss of vision. Positive for blurry vision, see HPI ENT ROS: negative for - epistaxis, nasal discharge, oral lesions, sore throat, tinnitus. Positive for vertigo, see HPI Allergy and Immunology ROS: negative for - hives or itchy/watery eyes Hematological and Lymphatic ROS: negative for - bleeding problems, bruising or swollen lymph nodes Endocrine ROS: negative for -  temperature intolerance Respiratory ROS: negative for - cough, hemoptysis, shortness of breath or wheezing Cardiovascular ROS: negative for - chest pain, dyspnea on exertion, edema or irregular heartbeat Gastrointestinal ROS: negative for - abdominal pain, diarrhea, hematemesis, vomiting or stool incontinence. Positive for nausea, see HPI Genito-Urinary ROS: negative for - dysuria, hematuria, incontinence or urinary frequency/urgency Musculoskeletal  ROS: negative for - joint swelling or muscular weakness Neurological ROS: as noted in HPI Dermatological ROS: negative for rash and skin lesion changes   Blood pressure 128/64, pulse 73, temperature 98.6 F (37 C), temperature source Oral, resp. rate 16, height 5\' 11"  (1.803 m), weight 65.8 kg, SpO2 98 %.   General Examination:                                                                                                       Physical Exam  HEENT-  Normocephalic, no lesions, without obvious abnormality.  Normal external eye and conjunctiva.   Cardiovascular-  pulses palpable throughout   Lungs- no excessive working breathing.  Saturations within normal limits Abdomen- All 4 quadrants palpated and nontender Extremities- Warm, dry and intact Musculoskeletal-no joint tenderness, deformity or swelling Skin-warm and dry, no hyperpigmentation, vitiligo, or suspicious lesions  Neurological Examination Mental Status: Alert, oriented, thought content  appropriate.  Speech fluent without evidence of aphasia.  Able to follow 3 step commands without difficulty. Able to say how many quarters in 2.75. Able to spell WORLD frontwards and backwards.  Cranial Nerves: II:  Visual fields grossly normal,  III,IV, VI: ptosis not present, extra-ocular motions intact bilaterally pupils equal, round, reactive to light and accommodation. A few beats of horizontal gaze nystagmus noted bilaterally  V,VII: smile symmetric, facial light touch sensation normal bilaterally VIII: hearing normal bilaterally IX,X: uvula rises symmetrically XI: bilateral shoulder shrug XII: midline tongue extension Motor: Right : Upper extremity   5/5    Left:     Upper extremity   5/5  Lower extremity   5/5     Lower extremity   5/5 Tone and bulk:normal tone throughout; no atrophy noted Sensory:  light touch intact throughout, bilaterally Cerebellar: normal finger-to-nose, normal rapid alternating movements and normal heel-to-shin test  No red desaturation identified (tested holding up a red pen and having him cover one eye and the the other back and forth)   Lab Results: Basic Metabolic Panel: Recent Labs  Lab 05/28/20 2230  NA 140  K 3.7  CL 103  CO2 27  GLUCOSE 100*  BUN 13  CREATININE 1.03  CALCIUM 9.9    CBC: Recent Labs  Lab 05/28/20 2230  WBC 6.7  HGB 16.6  HCT 49.1  MCV 87.1  PLT 190    Cardiac Enzymes: No results for input(s): CKTOTAL, CKMB, CKMBINDEX, TROPONINI in the last 168 hours.  Lipid Panel: No results for input(s): CHOL, TRIG, HDL, CHOLHDL, VLDL, LDLCALC in the last 168 hours.  Imaging: MR Brain W Wo Contrast  Result Date: 05/28/2020 CLINICAL DATA:  Monocular vision loss on the right with exertion. EXAM: MRI HEAD WITHOUT AND WITH CONTRAST TECHNIQUE: Multiplanar, multiecho pulse sequences of the brain and surrounding structures were obtained without and with intravenous contrast. CONTRAST:  42mL MULTIHANCE GADOBENATE DIMEGLUMINE 529  MG/ML IV SOLN COMPARISON:  None. FINDINGS: Brain: There are numerous foci of abnormal T2 and FLAIR signal within the hemispheric white matter, with a distribution and morphology highly suggestive of multiple sclerosis. No lesion  is seen affecting the brainstem, there is minor involvement of the cerebellum in the region of the left middle cerebellar peduncle. Cerebral hemispheres show approximately 20-25 lesions within each hemisphere. None show true restricted diffusion. There is abnormal enhancement associated with a left posterior frontal deep white matter lesion. No evidence of mass, hemorrhage, hydrocephalus or extra-axial collection. Vascular: Major vessels at the base of the brain show flow. Skull and upper cervical spine: Negative Sinuses/Orbits: Sinuses are clear. No orbital pathology is seen. Dedicated orbital exam was not ordered or performed, but I do not see evidence of optic neuritis. Both globes appear normal. Orbital apices appear normal. Other: None IMPRESSION: Numerous foci of abnormal T2 and FLAIR signal within the cerebral hemispheric white matter, with a distribution and morphology highly suggestive of multiple sclerosis. Single left posterior frontal white matter lesion shows contrast enhancement, indicating active disease. Electronically Signed   By: Paulina Fusi M.D.   On: 05/28/2020 14:19   Assessment:  Albert Edwards is an 24 y.o. male presenting after outpatient MRI identified Multiple Sclerosis. He is very active and healthy baseline. Past medical history of IBS that is most active when he is stressed at school. He is currently attending [employer] for a Masters Degree in Engineering. Over the past month he had two episodes of dizziness, one lasting 3-4 days and one lasting 1 day. He has also experienced blurred vision out of his right eye when he is briskly walking or jogging. Outpatient Brain MRI consistent with multiple sclerosis     MRI Brain IMPRESSION:Numerous foci of  abnormal T2 and FLAIR signal within the cerebral hemispheric white matter, with a distribution and morphology highly suggestive of multiple sclerosis. Single left posterior frontal white matter lesion shows contrast enhancement, indicating active disease.  The patient was most interested in the prognosis of MS. Dr. Amada Jupiter shared the typical relapsing and remitting nature of MS flares and discussed that many treatment options are available to reduce the number of flares. Dr. Amada Jupiter explained that many people with multiple sclerosis go on to live very normal and productive lives. However it is possible over time that may develop some deficits, and that amount of disability, not shortened lifespan, is of greatest concern in multiple sclerosis.   Impression: Multiple Sclerosis: New Diagnosis   Recommendations: 1. New Diagnosis of Multiple Sclerosis. IV Solumedrol x 3 days. First dose given today. Next doses on Sunday and Monday.  2. Cervical and Thoracic spine MRI with and without contrast to evaluate new Multiple Sclerosis Diagnosis. This will be important as a baseline and to compare for future events.  3. The patient is currently attending school at [employer] in [place], however his parents live in [place] and he prefers to see a neurologist in [place]. Referral to be placed to Dr. Despina Arias with [facility] to discuss MS treatment options.   Cathrine Muster DNP, FNP-C [facility] Nurse Practitioner  Note review and additional recommendations to follow from attending neurologist.   05/29/2020, 11:39 AM

## 2020-05-29 NOTE — ED Provider Notes (Addendum)
[facility] EMERGENCY DEPARTMENT Provider Note   CSN: [id number] Arrival date & time: 05/28/20  2121    History Chief Complaint  Patient presents with  . Eye Problem    Albert Edwards is a 24 y.o. male with past medical history significant for IBS who presents for evaluation of abnormal MRI.  Patient states he has been having intermittent bouts of dizziness over the last 2 to 3 weeks.  Initially described as room moving however started progressing.  Has felt slightly off balance.  Patient states that he has had intermittent blurred vision in his right eye which occurs primarily with exercise however mother who is an NP states occurs whenever his heart rate goes up.  He was given meclizine by his PCP.  Had an MRI outpatient with and without contrast which showed abnormal signal enhancement suggestive of active MS.  He has no prior history of MS.  He has no paresthesias.  No diplopia.  He denies any current headache, lightheadedness, dizziness, weakness, paresthesias, facial droop, difficulty with word finding, chest pain, shortness of breath abdominal pain.  Denies additional aggravating or alleviating factors.  History obtained from patient and past medical records.  No interpreter is used.  HPI     Past Medical History:  Diagnosis Date  . IBS (irritable colon syndrome)    Constipation predominant. Constipation- with BM abdominal pain improves. Comes in waves with higher stress. worse with less exercise. Plan: increase exercise, watch FODMAP, consider metamucil if that doesnt work    Patient Active Problem List   Diagnosis Date Noted  . Multiple sclerosis exacerbation (HCC) 05/29/2020  . IBS (irritable colon syndrome)   . Right leg pain 07/02/2014    Past Surgical History:  Procedure Laterality Date  . ADENOIDECTOMY     2006  . TONSILLECTOMY         Family History  Problem Relation Age of Onset  . Healthy Mother        and father  .  Hyperlipidemia Other        grandparent  . Hypertension Other        grandparent  . Diabetes Other        grandparent  . ADD / ADHD Brother     Social History   Tobacco Use  . Smoking status: Never Smoker  . Smokeless tobacco: Never Used  Substance Use Topics  . Alcohol use: No    Alcohol/week: 0.0 standard drinks  . Drug use: No    Home Medications Prior to Admission medications   Medication Sig Start Date End Date Taking? Authorizing Provider  acetaminophen (TYLENOL) 500 MG tablet Take 500 mg by mouth every 6 (six) hours as needed for moderate pain.   Yes [provider]  fexofenadine (ALLEGRA) 180 MG tablet Take 180 mg by mouth daily.   Yes [provider]  meclizine (ANTIVERT) 12.5 MG tablet Take 1-2 tablets (12.5-25 mg total) by mouth 3 (three) times daily as needed for dizziness. Tristan Schroeder 05/08/20  Yes Shelva Majestic, MD  hyoscyamine (LEVSIN) 0.125 MG tablet Take 1 tablet (0.125 mg total) by mouth every 4 (four) hours as needed (for abdominal cramping. 4 doses a day maximum.). Patient not taking: Reported on 05/29/2020 06/06/19   Shelva Majestic, MD    Allergies    Patient has no known allergies.  Review of Systems   Review of Systems  Constitutional: Negative.   HENT: Negative.   Eyes: Positive for visual disturbance. Negative for  photophobia, pain, discharge, redness and itching.  Respiratory: Negative.   Cardiovascular: Negative.   Gastrointestinal: Negative.   Genitourinary: Negative.   Musculoskeletal: Negative.   Skin: Negative.   Neurological: Negative.   All other systems reviewed and are negative.  Physical Exam Updated Vital Signs BP 128/64   Pulse 73   Temp 98.6 F (37 C) (Oral)   Resp 16   Ht 5\' 11"  (1.803 m)   Wt 65.8 kg   SpO2 98%   BMI 20.22 kg/m   Physical Exam Physical Exam  Constitutional: Pt is oriented to person, place, and time. Pt appears well-developed and well-nourished. No distress.  HENT:  Head:  Normocephalic and atraumatic.  Mouth/Throat: Oropharynx is clear and moist.  Eyes: Conjunctivae and EOM are normal. Pupils are equal, round, and reactive to light. No scleral icterus.  No vertical or rotational nystagmus. Possible end gaze nystagmus on right. Neck: Normal range of motion. Neck supple.  Full active and passive ROM without pain No midline or paraspinal tenderness No nuchal rigidity or meningeal signs  Cardiovascular: Normal rate, regular rhythm and intact distal pulses.   Pulmonary/Chest: Effort normal and breath sounds normal. No respiratory distress. Pt has no wheezes. No rales.  Abdominal: Soft. Bowel sounds are normal. There is no tenderness. There is no rebound and no guarding.  Musculoskeletal: Normal range of motion.  Lymphadenopathy:    No cervical adenopathy.  Neurological: Pt. is alert and oriented to person, place, and time. He has normal reflexes. No cranial nerve deficit.  Exhibits normal muscle tone. Coordination normal.  Mental Status:  Alert, oriented, thought content appropriate. Speech fluent without evidence of aphasia. Able to follow 2 step commands without difficulty.  Cranial Nerves:  II:  Peripheral visual fields grossly normal, pupils equal, round, reactive to light III,IV, VI: ptosis not present, extra-ocular motions intact bilaterally  V,VII: smile symmetric, facial light touch sensation equal VIII: hearing grossly normal bilaterally  IX,X: midline uvula rise  XI: bilateral shoulder shrug equal and strong XII: midline tongue extension  Motor:  5/5 in upper and lower extremities bilaterally including strong and equal grip strength and dorsiflexion/plantar flexion Sensory: Pinprick and light touch normal in all extremities.  Deep Tendon Reflexes: 2+ and symmetric  Cerebellar: normal finger-to-nose with bilateral upper extremities Gait: normal gait and balance CV: distal pulses palpable throughout   Skin: Skin is warm and dry. No rash noted. Pt is  not diaphoretic.  Psychiatric: Pt has a normal mood and affect. Behavior is normal. Judgment and thought content normal.  Nursing note and vitals reviewed. ED Results / Procedures / Treatments   Labs (all labs ordered are listed, but only abnormal results are displayed) Labs Reviewed  BASIC METABOLIC PANEL - Abnormal; Notable for the following components:      Result Value   Glucose, Bld 100 (*)    All other components within normal limits  SARS CORONAVIRUS 2 BY RT PCR (HOSPITAL ORDER, PERFORMED IN [facility])  CBC  HIV ANTIBODY (ROUTINE TESTING W REFLEX)  VITAMIN D 25 HYDROXY (VIT D DEFICIENCY, FRACTURES)  VITAMIN B12    EKG None  Radiology MR Brain W Wo Contrast  Result Date: 05/28/2020 CLINICAL DATA:  Monocular vision loss on the right with exertion. EXAM: MRI HEAD WITHOUT AND WITH CONTRAST TECHNIQUE: Multiplanar, multiecho pulse sequences of the brain and surrounding structures were obtained without and with intravenous contrast. CONTRAST:  72mL MULTIHANCE GADOBENATE DIMEGLUMINE 529 MG/ML IV SOLN COMPARISON:  None. FINDINGS: Brain: There are  numerous foci of abnormal T2 and FLAIR signal within the hemispheric white matter, with a distribution and morphology highly suggestive of multiple sclerosis. No lesion is seen affecting the brainstem, there is minor involvement of the cerebellum in the region of the left middle cerebellar peduncle. Cerebral hemispheres show approximately 20-25 lesions within each hemisphere. None show true restricted diffusion. There is abnormal enhancement associated with a left posterior frontal deep white matter lesion. No evidence of mass, hemorrhage, hydrocephalus or extra-axial collection. Vascular: Major vessels at the base of the brain show flow. Skull and upper cervical spine: Negative Sinuses/Orbits: Sinuses are clear. No orbital pathology is seen. Dedicated orbital exam was not ordered or performed, but I do not see evidence of optic  neuritis. Both globes appear normal. Orbital apices appear normal. Other: None IMPRESSION: Numerous foci of abnormal T2 and FLAIR signal within the cerebral hemispheric white matter, with a distribution and morphology highly suggestive of multiple sclerosis. Single left posterior frontal white matter lesion shows contrast enhancement, indicating active disease. Electronically Signed   By: Paulina Fusi M.D.   On: 05/28/2020 14:19   Procedures Procedures (including critical care time)  Medications Ordered in ED Medications  loratadine (CLARITIN) tablet 10 mg (has no administration in time range)  enoxaparin (LOVENOX) injection 40 mg (has no administration in time range)  pantoprazole (PROTONIX) EC tablet 40 mg (has no administration in time range)  sodium chloride flush (NS) 0.9 % injection 3 mL (3 mLs Intravenous Given 05/29/20 1146)  acetaminophen (TYLENOL) tablet 650 mg (has no administration in time range)    Or  acetaminophen (TYLENOL) suppository 650 mg (has no administration in time range)  ondansetron (ZOFRAN) tablet 4 mg (has no administration in time range)    Or  ondansetron (ZOFRAN) injection 4 mg (has no administration in time range)  albuterol (PROVENTIL) (2.5 MG/3ML) 0.083% nebulizer solution 2.5 mg (has no administration in time range)  methylPREDNISolone sodium succinate (SOLU-MEDROL) 1,000 mg in sodium chloride 0.9 % 50 mL IVPB (has no administration in time range)  methylPREDNISolone sodium succinate (SOLU-MEDROL) 1,000 mg in sodium chloride 0.9 % 50 mL IVPB (0 mg Intravenous Stopped 05/29/20 1147)    ED Course  I have reviewed the triage vital signs and the nursing notes.  Pertinent labs & imaging results that were available during my care of the patient were reviewed by me and considered in my medical decision making (see chart for details).  24 year old otherwise healthy male presents for evaluation of intermittent blurred vision in his right eye as well as dizziness x3  weeks.  Was seen by PCP.  He has no current symptoms at this time.  Has nonfocal neuro exam without deficits.  No neck stiffness or neck rigidity.  Seen by optometry was noted to not have abnormalities on his vision exam.  Was seen by PCP who ordered MRI.  MRI concerning for active MS, sent here for further evaluation.  Labs and imaging personally reviewed and interpreted:  CBC without leukocytosis Metabolic panel without electrolyte, renal or abnormality COVID negative  CONSULT with Dr. Amada Jupiter with Neurology who recommends Solumedrol and admission to medicine.  Discussed additional imaging.  Dr. Amada Jupiter states he will evaluate patient and place additional orders for additional imaging if needed at that time.  CONSULT with Dr. Katrinka Blazing with [facility] who agrees to evaluate patient for admission.  The patient appears reasonably stabilized for admission considering the current resources, flow, and capabilities available in the ED at this time, and I doubt any  other [facility] requiring further screening and/or treatment in the ED prior to admission.    MDM Rules/Calculators/A&P                          Final Clinical Impression(s) / ED Diagnoses Final diagnoses:  Multiple sclerosis [facility])    Rx / DC Orders ED Discharge Orders    None       Bellatrix Devonshire A, PA-C 05/29/20 1223    Fred Hammes A, PA-C 05/29/20 1235    Sabas Sous, MD 05/29/20 1645

## 2020-05-29 NOTE — H&P (Signed)
History and Physical    Albert Edwards ELF:[id number] DOB: February 27, 1996 DOA: 05/28/2020  Referring MD/NP/PA: Thereasa Distance, PA-C PCP: Shelva Majestic, MD  Patient coming from: Home  Chief Complaint: Blurry vision out of right eye  I have personally briefly reviewed patient's old medical records in [facility]   HPI: Albert Edwards is a 24 y.o. male with medical history significant of IBS presents with complaints of intermittent blurry vision out of his right eye.  Patient reports that he initially had episodes of dizziness which started last month.  He was evaluated by his primary care provider via telehealth visit on 8/28.  At that time symptoms were thought to be secondary to benign positional vertigo.  He was given meclizine and YouTube link to try the the head maneuvers to relieve symptoms.  Those therapies appeared to help.  However, since that time he has had these intermittent episodes of blurry vision of the right eye that he notices mostly when running.  The severity of symptoms correlated with how fast his heart rates goes up.  He also noted some intermittent episodes where he felt slightly off balance, but never fell or reported focal weakness.  Denies any significant eye pain, headache, issues out of the left eye, numbness/tingling sensations, chest pain, or palpitations.  He reports a family history significant of vitamin B12 deficiency on his father's side and what he thinks is possibly low thyroid.  Follow back up with his primary care provider with it symptoms and was arranged for him to have MRI of the brain which was performed yesterday.  MRI revealed numerous foci of abnormal T2 and flair signals within the cerebral hemispheric white matter highly suggestive of multiple sclerosis.  Following the study he was advised to come to the emergency department to receive high-dose steroids.  ED Course: Upon admission into the emergency department patient was noted  to have stable vital signs with labs relatively within normal limits.  Neurology had been consulted and patient was started on Solu-Medrol 1000 mg IV.  TRH was called to admit.    Review of Systems  Constitutional: Negative for fever and malaise/fatigue.  HENT: Negative for ear discharge and nosebleeds.   Eyes: Positive for blurred vision. Negative for pain.  Respiratory: Negative for cough and shortness of breath.   Cardiovascular: Negative for chest pain and leg swelling.  Gastrointestinal: Negative for abdominal pain, blood in stool, nausea and vomiting.  Genitourinary: Negative for dysuria and hematuria.  Musculoskeletal: Negative for falls and myalgias.  Skin: Negative for itching and rash.  Neurological: Positive for dizziness. Negative for loss of consciousness and weakness.  Psychiatric/Behavioral: Negative for memory loss and substance abuse.    Past Medical History:  Diagnosis Date   IBS (irritable colon syndrome)    Constipation predominant. Constipation- with BM abdominal pain improves. Comes in waves with higher stress. worse with less exercise. Plan: increase exercise, watch FODMAP, consider metamucil if that doesnt work    Past Surgical History:  Procedure Laterality Date   ADENOIDECTOMY     2006   TONSILLECTOMY       reports that he has never smoked. He has never used smokeless tobacco. He reports that he does not drink alcohol and does not use drugs.  No Known Allergies  Family History  Problem Relation Age of Onset   Healthy Mother        and father   Hyperlipidemia Other        grandparent  Hypertension Other        grandparent   Diabetes Other        grandparent   ADD / ADHD Brother     Prior to Admission medications   Medication Sig Start Date End Date Taking? Authorizing Provider  acetaminophen (TYLENOL) 500 MG tablet Take 500 mg by mouth every 6 (six) hours as needed for moderate pain.   Yes [provider]  fexofenadine  (ALLEGRA) 180 MG tablet Take 180 mg by mouth daily.   Yes [provider]  meclizine (ANTIVERT) 12.5 MG tablet Take 1-2 tablets (12.5-25 mg total) by mouth 3 (three) times daily as needed for dizziness. Tristan Schroeder 05/08/20  Yes Shelva Majestic, MD  hyoscyamine (LEVSIN) 0.125 MG tablet Take 1 tablet (0.125 mg total) by mouth every 4 (four) hours as needed (for abdominal cramping. 4 doses a day maximum.). Patient not taking: Reported on 05/29/2020 06/06/19   Shelva Majestic, MD    Physical Exam:  Constitutional: Young male NAD, calm, comfortable Vitals:   05/29/20 0926 05/29/20 1000 05/29/20 1045 05/29/20 1115  BP:  124/65 139/73 128/64  Pulse:  86 71 73  Resp:      Temp:      TempSrc:      SpO2:  99% 99% 98%  Weight: 65.8 kg     Height: 5\' 11"  (1.803 m)      Eyes: Pupils are equal, lids and conjunctivae normal ENMT: Mucous membranes are moist. Posterior pharynx clear of any exudate or lesions. Normal dentition.  Neck: normal, supple, no masses, no thyromegaly Respiratory: clear to auscultation bilaterally, no wheezing, no crackles. Normal respiratory effort. No accessory muscle use.  Cardiovascular: Regular rate and rhythm, no murmurs / rubs / gallops. No extremity edema.  Abdomen: No significant tenderness appreciated.  Normal bowel sounds. Musculoskeletal: no clubbing / cyanosis. No joint deformity upper and lower extremities. Good ROM, no contractures. Normal muscle tone.  Skin: no rashes, lesions, ulcers. No induration Neurologic: CN 2-12 grossly intact. Sensation intact, DTR normal. Strength 5/5 in all 4.  Psychiatric: Normal judgment and insight. Alert and oriented x 3. Normal mood.     Labs on Admission: I have personally reviewed following labs and imaging studies  CBC: Recent Labs  Lab 05/28/20 2230  WBC 6.7  HGB 16.6  HCT 49.1  MCV 87.1  PLT 190   Basic Metabolic Panel: Recent Labs  Lab 05/28/20 2230  NA 140  K 3.7  CL 103  CO2 27  GLUCOSE 100*   BUN 13  CREATININE 1.03  CALCIUM 9.9   GFR: Estimated Creatinine Clearance: 104.7 mL/min (by C-G formula based on SCr of 1.03 mg/dL). Liver Function Tests: No results for input(s): AST, ALT, ALKPHOS, BILITOT, PROT, ALBUMIN in the last 168 hours. No results for input(s): LIPASE, AMYLASE in the last 168 hours. No results for input(s): AMMONIA in the last 168 hours. Coagulation Profile: No results for input(s): INR, PROTIME in the last 168 hours. Cardiac Enzymes: No results for input(s): CKTOTAL, CKMB, CKMBINDEX, TROPONINI in the last 168 hours. BNP (last 3 results) No results for input(s): PROBNP in the last 8760 hours. HbA1C: No results for input(s): HGBA1C in the last 72 hours. CBG: No results for input(s): GLUCAP in the last 168 hours. Lipid Profile: No results for input(s): CHOL, HDL, LDLCALC, TRIG, CHOLHDL, LDLDIRECT in the last 72 hours. Thyroid Function Tests: No results for input(s): TSH, T4TOTAL, FREET4, T3FREE, THYROIDAB in the last 72 hours. Anemia Panel: No results for  input(s): VITAMINB12, FOLATE, FERRITIN, TIBC, IRON, RETICCTPCT in the last 72 hours. Urine analysis: No results found for: COLORURINE, APPEARANCEUR, LABSPEC, PHURINE, GLUCOSEU, HGBUR, BILIRUBINUR, KETONESUR, PROTEINUR, UROBILINOGEN, NITRITE, LEUKOCYTESUR Sepsis Labs: Recent Results (from the past 240 hour(s))  SARS Coronavirus 2 by RT PCR (hospital order, performed in [facility] hospital lab) Nasopharyngeal Nasopharyngeal Swab     Status: None   Collection Time: 05/29/20  9:07 AM   Specimen: Nasopharyngeal Swab  Result Value Ref Range Status   SARS Coronavirus 2 NEGATIVE NEGATIVE Final    Comment: (NOTE) SARS-CoV-2 target nucleic acids are NOT DETECTED.  The SARS-CoV-2 RNA is generally detectable in upper and lower respiratory specimens during the acute phase of infection. The lowest concentration of SARS-CoV-2 viral copies this assay can detect is 250 copies / mL. A negative result does not  preclude SARS-CoV-2 infection and should not be used as the sole basis for treatment or other patient management decisions.  A negative result may occur with improper specimen collection / handling, submission of specimen other than nasopharyngeal swab, presence of viral mutation(s) within the areas targeted by this assay, and inadequate number of viral copies (<250 copies / mL). A negative result must be combined with clinical observations, patient history, and epidemiological information.  Fact Sheet for Patients:   BoilerBrush.com.cy  Fact Sheet for Healthcare Providers: https://pope.com/  This test is not yet approved or  cleared by the [place] FDA and has been authorized for detection and/or diagnosis of SARS-CoV-2 by FDA under an Emergency Use Authorization (EUA).  This EUA will remain in effect (meaning this test can be used) for the duration of the COVID-19 declaration under Section 564(b)(1) of the Act, 21 U.S.C. section 360bbb-3(b)(1), unless the authorization is terminated or revoked sooner.  Performed at [facility], [address] [place], [place] [address]      Radiological Exams on Admission: MR Brain W Wo Contrast  Result Date: 05/28/2020 CLINICAL DATA:  Monocular vision loss on the right with exertion. EXAM: MRI HEAD WITHOUT AND WITH CONTRAST TECHNIQUE: Multiplanar, multiecho pulse sequences of the brain and surrounding structures were obtained without and with intravenous contrast. CONTRAST:  53mL MULTIHANCE GADOBENATE DIMEGLUMINE 529 MG/ML IV SOLN COMPARISON:  None. FINDINGS: Brain: There are numerous foci of abnormal T2 and FLAIR signal within the hemispheric white matter, with a distribution and morphology highly suggestive of multiple sclerosis. No lesion is seen affecting the brainstem, there is minor involvement of the cerebellum in the region of the left middle cerebellar peduncle. Cerebral  hemispheres show approximately 20-25 lesions within each hemisphere. None show true restricted diffusion. There is abnormal enhancement associated with a left posterior frontal deep white matter lesion. No evidence of mass, hemorrhage, hydrocephalus or extra-axial collection. Vascular: Major vessels at the base of the brain show flow. Skull and upper cervical spine: Negative Sinuses/Orbits: Sinuses are clear. No orbital pathology is seen. Dedicated orbital exam was not ordered or performed, but I do not see evidence of optic neuritis. Both globes appear normal. Orbital apices appear normal. Other: None IMPRESSION: Numerous foci of abnormal T2 and FLAIR signal within the cerebral hemispheric white matter, with a distribution and morphology highly suggestive of multiple sclerosis. Single left posterior frontal white matter lesion shows contrast enhancement, indicating active disease. Electronically Signed   By: Paulina Fusi M.D.   On: 05/28/2020 14:19      Assessment/Plan  Multiple sclerosis flare: Acute.  Patient presents with complaints of intermittent right eye blurry vision and episodes  of vertigo over the last month.  Work-up with MRI of the brain ordered by his PCP noted to have numerous foci of abnormal T2 and flair signal in the cerebral hemispheric white matter suggestive of multiple sclerosis on MRI.  Neurology had been formally consulted and started patient on Solu-Medrol 1 g IV. -Admit to a MedSurg bed -Neurochecks every 4 hours -Continue Solu-Medrol 1 g IV daily -Additional MRI imaging per neurology -Appreciate neurology consultative services, we will follow-up for any further recommendations  History of IBS  GI prophylaxis: Protonix  DVT prophylaxis: Lovenox Code Status: Full Family Communication: Patient reports that his mother was already updated by neurology Disposition Plan: Likely discharge home in 5 days. Consults called: Neurology Admission status:Inpatient status requiring  more than 2 midnight stay for high-dose IV steroids.  Clydie Braun MD [facility] Pager [phone]   If 7PM-7AM, please contact night-coverage [email or web address] Password [facility]  05/29/2020, 11:34 AM

## 2020-05-30 ENCOUNTER — Inpatient Hospital Stay (HOSPITAL_COMMUNITY): Payer: BC Managed Care – PPO

## 2020-05-30 LAB — BASIC METABOLIC PANEL
Anion gap: 12 (ref 5–15)
BUN: 19 mg/dL (ref 6–20)
CO2: 20 mmol/L — ABNORMAL LOW (ref 22–32)
Calcium: 9.6 mg/dL (ref 8.9–10.3)
Chloride: 106 mmol/L (ref 98–111)
Creatinine, Ser: 0.78 mg/dL (ref 0.61–1.24)
GFR calc Af Amer: >60 mL/min (ref >60)
GFR calc non Af Amer: >60 mL/min (ref >60)
Glucose, Bld: 119 mg/dL — ABNORMAL HIGH (ref 70–99)
Potassium: 4.2 mmol/L (ref 3.5–5.1)
Sodium: 138 mmol/L (ref 135–145)

## 2020-05-30 LAB — CBC
HCT: 45.2 % (ref 39.0–52.0)
Hemoglobin: 15.4 g/dL (ref 13.0–17.0)
MCH: 30 pg (ref 26.0–34.0)
MCHC: 34.1 g/dL (ref 30.0–36.0)
MCV: 88.1 fL (ref 80.0–100.0)
Platelets: 182 10*3/uL (ref 150–400)
RBC: 5.13 MIL/uL (ref 4.22–5.81)
RDW: 12.4 % (ref 11.5–15.5)
WBC: 8.4 10*3/uL (ref 4.0–10.5)
nRBC: 0 % (ref 0.0–0.2)

## 2020-05-30 IMAGING — MR MR THORACIC SPINE WO/W CM
10 of 17 series · 26 of 48 positions shown · IV contrast (gadavist)
Comparison: MRI head [DATE]

CLINICAL DATA: Acute or progressive myelopathy. MRI brain highly
suggestive of multiple sclerosis.

EXAM:
MRI CERVICAL AND THORACIC SPINE WITHOUT AND WITH CONTRAST
TECHNIQUE: Multiplanar and multiecho pulse sequences of the cervical spine, to
include the craniocervical junction and cervicothoracic junction,
and the thoracic spine, were obtained without and with intravenous
contrast.
CONTRAST:  6mL GADAVIST GADOBUTROL 1 MMOL/ML IV SOLN

[Series 5: T2 · sagittal · 3.0mm · 0.69mm/px · 1 of 15 slices shown (1 of 4)]
[im 1/15]
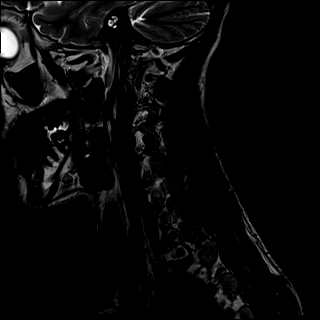

[Series 6: T1 · sagittal · 3.0mm · 0.69mm/px · 2 of 15 slices shown (1 of 5)]
[im 1/15]
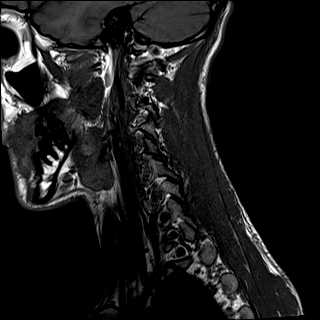
[im 15/15]
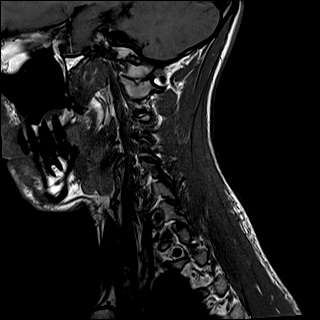

[Series 8: T2 · axial · 3.0mm · 0.66mm/px · z∈[-135,-19]mm · 4 of 38 slices shown (2 of 4)]
[im 1/38]
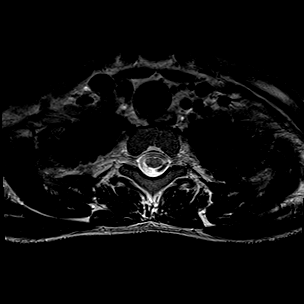
[im 13/38]
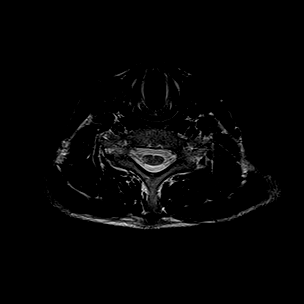
[im 25/38]
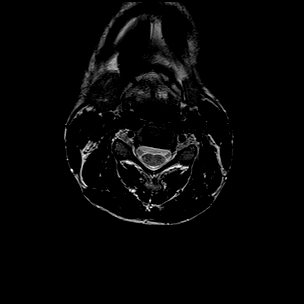
[im 38/38]
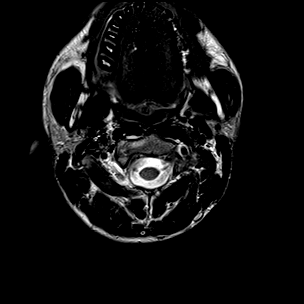

[Series 10: T1 · axial · 3.0mm · 0.39mm/px · z∈[-135,-19]mm · 4 of 38 slices shown (2 of 5)]
[im 1/38]
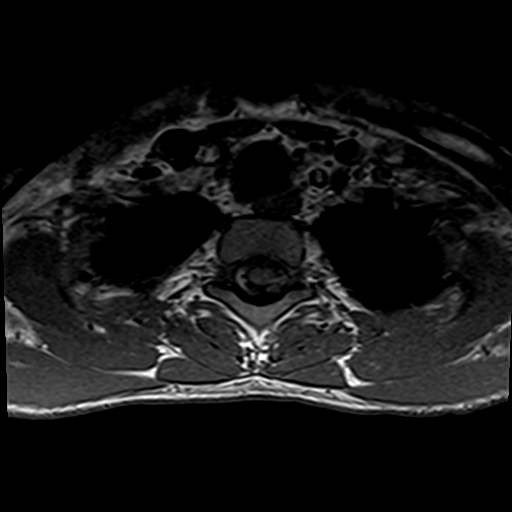
[im 13/38]
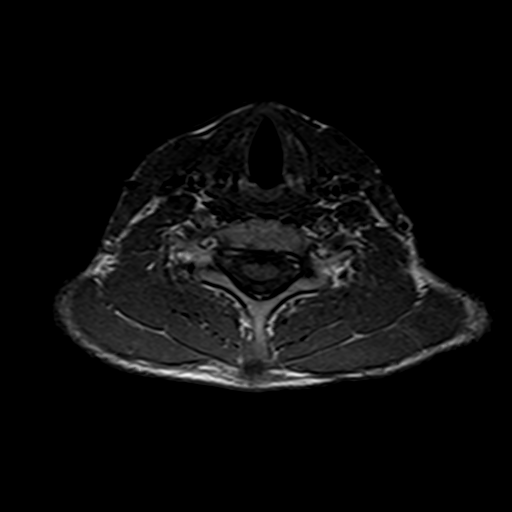
[im 25/38]
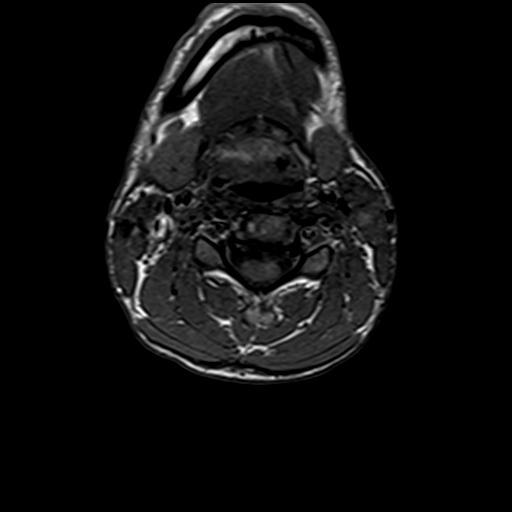
[im 38/38]
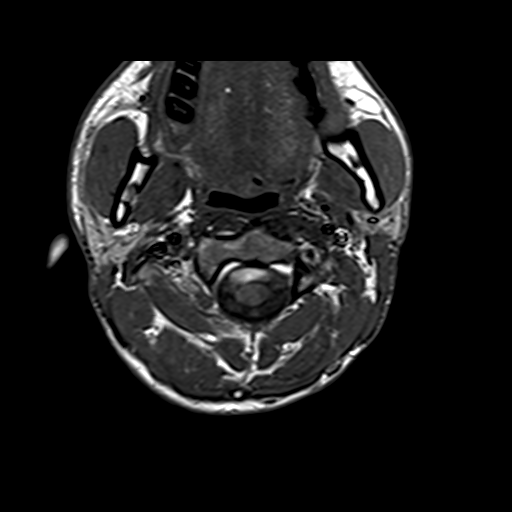

[Series 14: T1 · sagittal · 6.0mm · 1.23mm/px · 1 of 12 slices shown (3 of 5)]
[im 1/12]
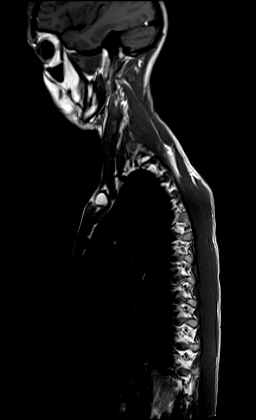

[Series 15: T2 · sagittal · 3.0mm · 0.76mm/px · 2 of 17 slices shown (3 of 4)]
[im 1/17]
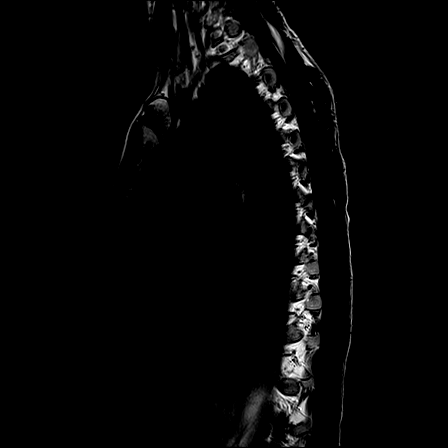
[im 17/17]
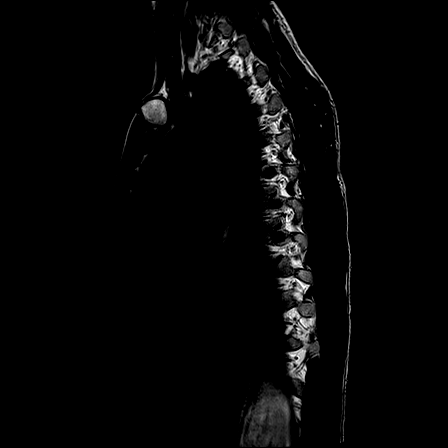

[Series 17: T1 · sagittal · 3.0mm · 0.76mm/px · 2 of 17 slices shown (4 of 5)]
[im 1/17]
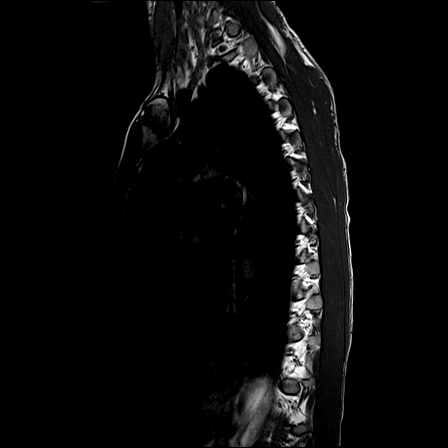
[im 17/17]
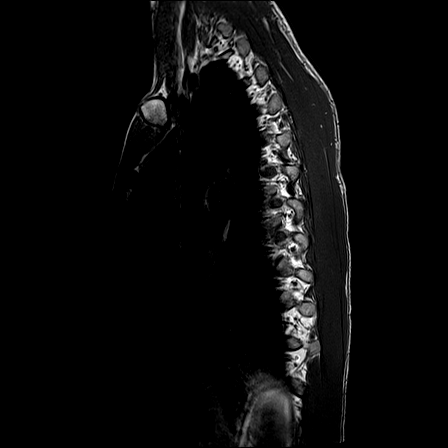

[Series 18: T2 · axial · 4.0mm · 0.59mm/px · z∈[-372,-100]mm · 4 of 40 slices shown (4 of 4)]
[im 1/40]
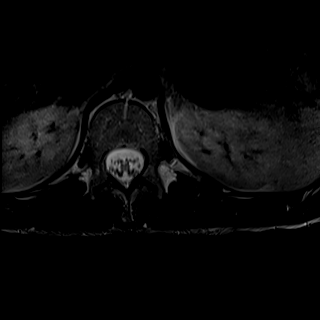
[im 14/40]
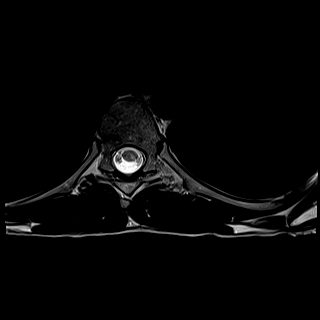
[im 27/40]
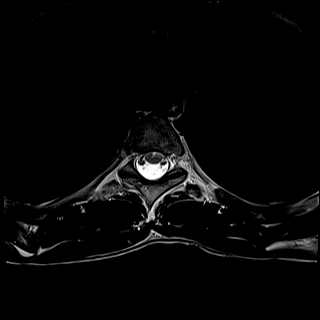
[im 40/40]
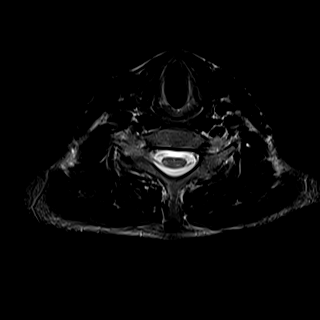

[Series 20: T1 · axial · non-contrast · 4.0mm · 0.31mm/px · z∈[-372,-100]mm · 4 of 40 slices shown (5 of 5)]
[im 1/40]
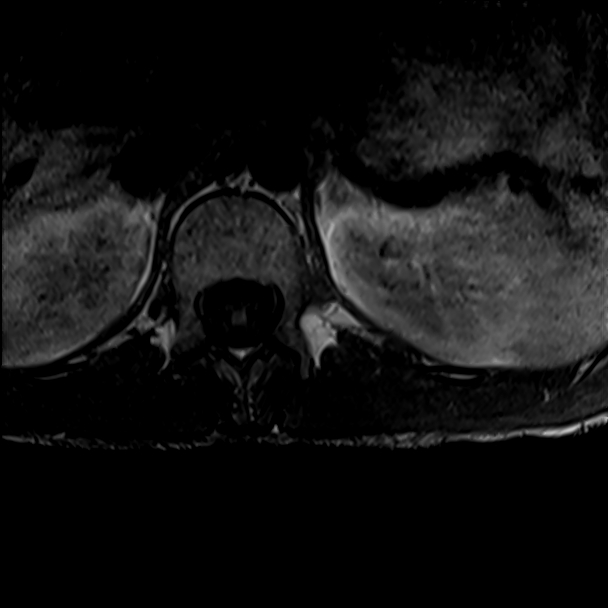
[im 14/40]
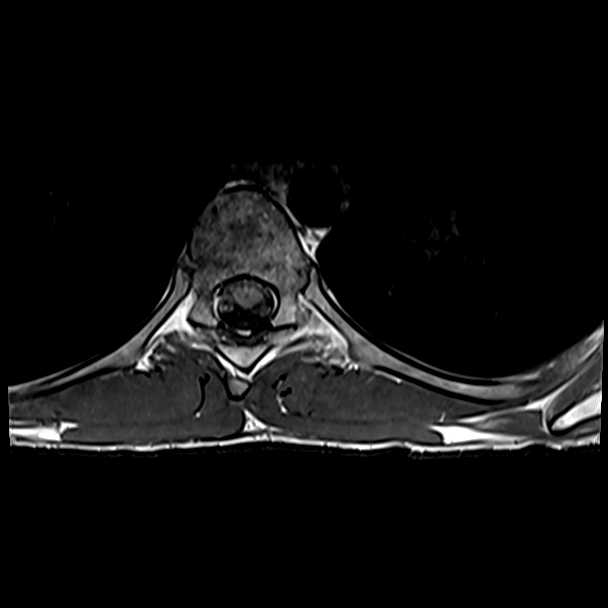
[im 27/40]
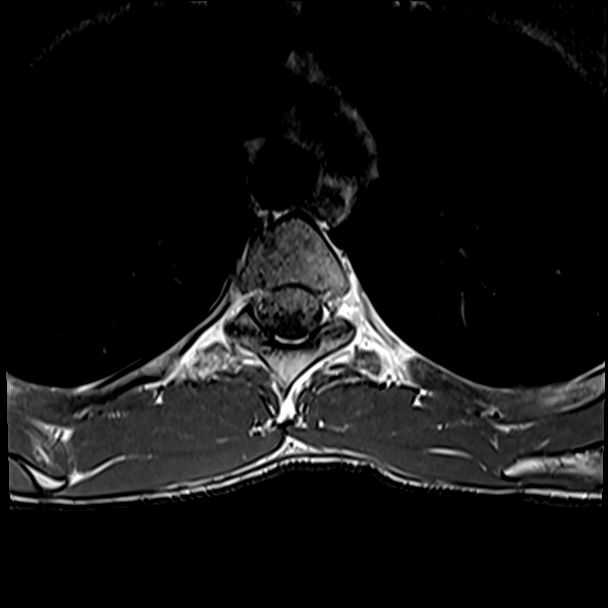
[im 40/40]
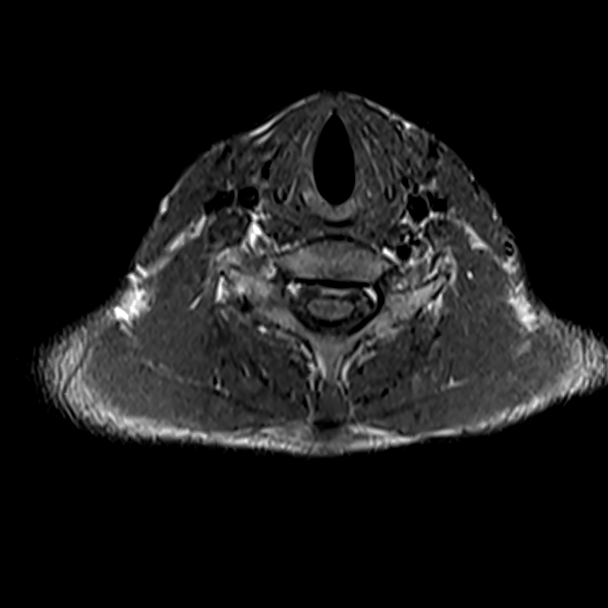

[Series 21: T1 fat-sat post-contrast · sagittal · 3.0mm · 0.76mm/px · 2 of 17 slices shown]
[im 1/17]
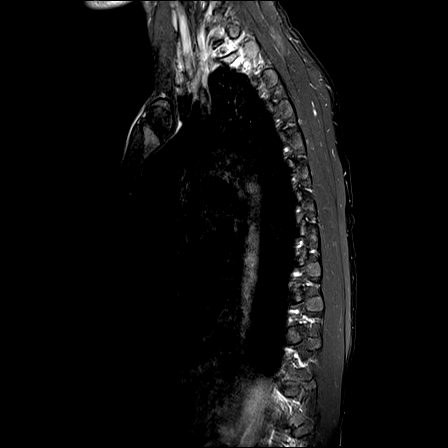
[im 17/17]
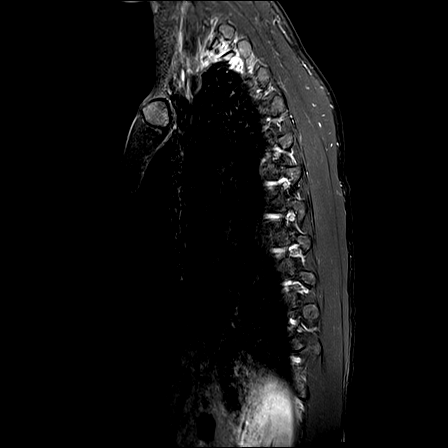

[26 of 48 positions shown; findings below may reference images not displayed]

FINDINGS: MRI CERVICAL SPINE FINDINGS

Alignment: Normal

Vertebrae: Normal bone marrow.  Negative for fracture or mass.

Cord: Extensive spinal cord disease. Numerous ill-defined areas of
hyperintensity in the cord throughout the cervical spine extending
into the upper thoracic spine. No cord expansion. Lesions are
present in the central cord and lateral cord bilaterally. Patchy
enhancement of spinal cord lesion on the right at C5-6. No other
enhancing lesions.

Posterior Fossa, vertebral arteries, paraspinal tissues: Mild patchy
hyperintensity in the medulla.

Disc levels:

Negative for disc protrusion or spinal stenosis. No significant disc
degeneration.

MRI THORACIC SPINE FINDINGS

Alignment:  Normal.  Mild dextroscoliosis.

Vertebrae: Negative for fracture. 5 mm bone marrow lesion T3
vertebral body with hyperintensity on T2 and hypointensity on T1 and
well-circumscribed intense enhancement. Probable lipid poor
hemangioma.

Cord: Multiple cord lesions throughout the thoracic cord most
prominent at C7-T1 with additional smaller lesions throughout the
remainder of the thoracic cord. There is hyperintensity in the conus
medullaris. No enhancing cord lesion identified.

Paraspinal and other soft tissues: Negative for paraspinous mass or
soft tissue edema. No pleural effusion

Disc levels:

No significant disc degeneration or spinal stenosis.
IMPRESSION: Extensive disease in the cervical cord compatible with multiple
sclerosis. Enhancing lesion on the right at C5-6 due to active
demyelinization.

Moderate disease in the thoracic spinal cord with scattered lesions
throughout the cord including the conus medullaris. No enhancing
cord lesions in the thoracic spine.

## 2020-05-30 IMAGING — MR MR CERVICAL SPINE WO/W CM
10 of 17 series · 26 of 48 positions shown · IV contrast (gadavist)
Comparison: MRI head [DATE]

CLINICAL DATA: Acute or progressive myelopathy. MRI brain highly
suggestive of multiple sclerosis.

EXAM:
MRI CERVICAL AND THORACIC SPINE WITHOUT AND WITH CONTRAST
TECHNIQUE: Multiplanar and multiecho pulse sequences of the cervical spine, to
include the craniocervical junction and cervicothoracic junction,
and the thoracic spine, were obtained without and with intravenous
contrast.
CONTRAST:  6mL GADAVIST GADOBUTROL 1 MMOL/ML IV SOLN

[Series 5: T2 · sagittal · 3.0mm · 0.69mm/px · 1 of 15 slices shown (1 of 4)]
[im 1/15]
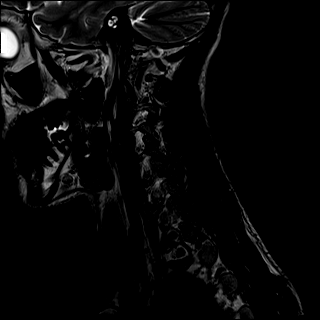

[Series 6: T1 · sagittal · 3.0mm · 0.69mm/px · 2 of 15 slices shown (1 of 5)]
[im 1/15]
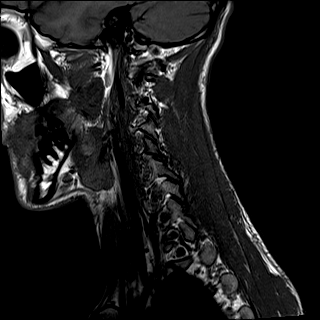
[im 15/15]
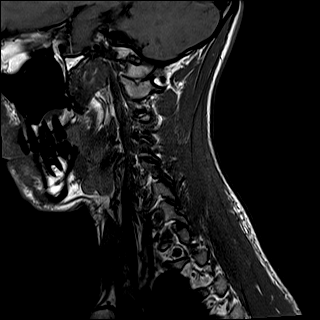

[Series 8: T2 · axial · 3.0mm · 0.66mm/px · z∈[-135,-19]mm · 4 of 38 slices shown (2 of 4)]
[im 1/38]
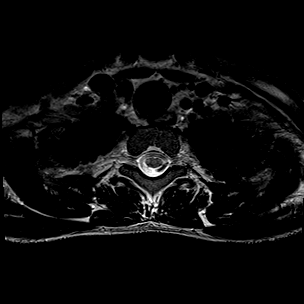
[im 13/38]
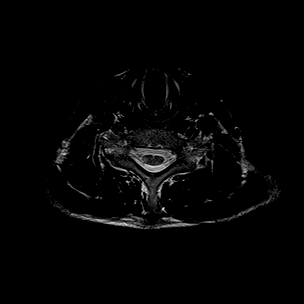
[im 25/38]
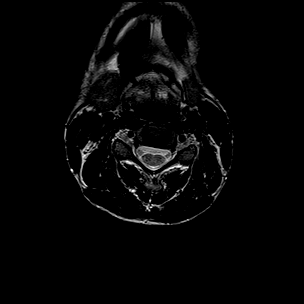
[im 38/38]
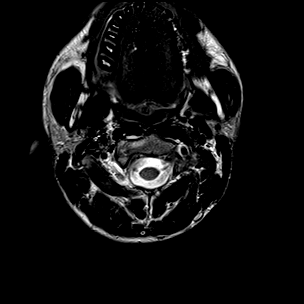

[Series 10: T1 · axial · 3.0mm · 0.39mm/px · z∈[-135,-19]mm · 4 of 38 slices shown (2 of 5)]
[im 1/38]
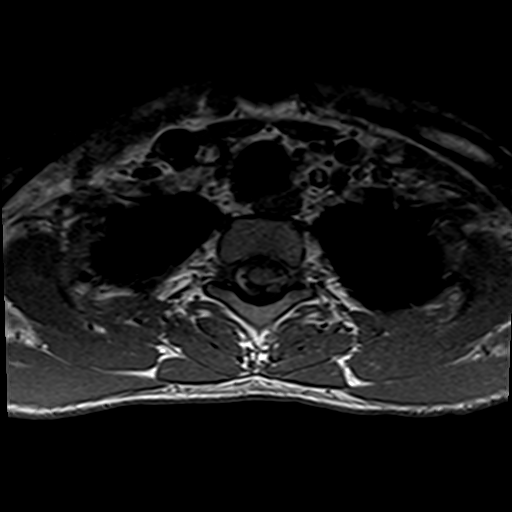
[im 13/38]
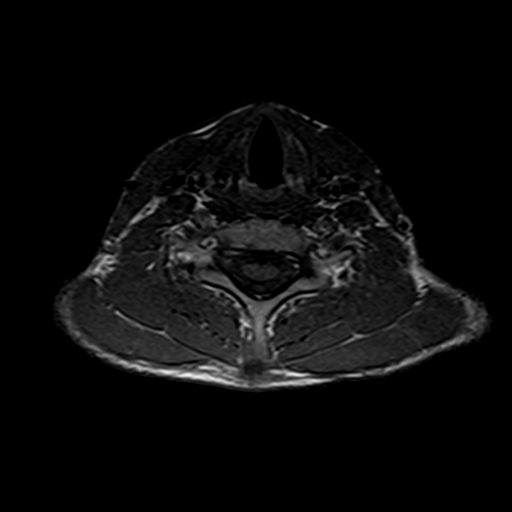
[im 25/38]
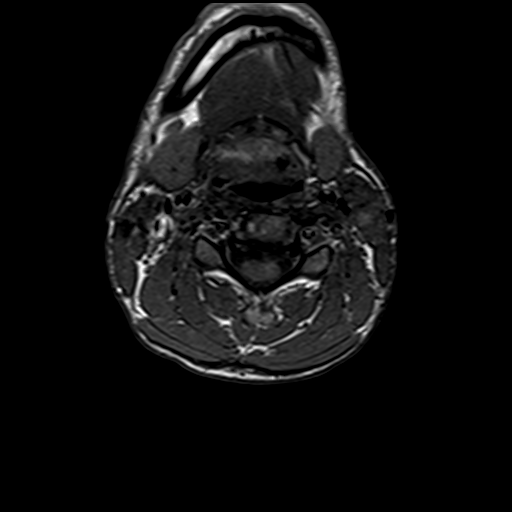
[im 38/38]
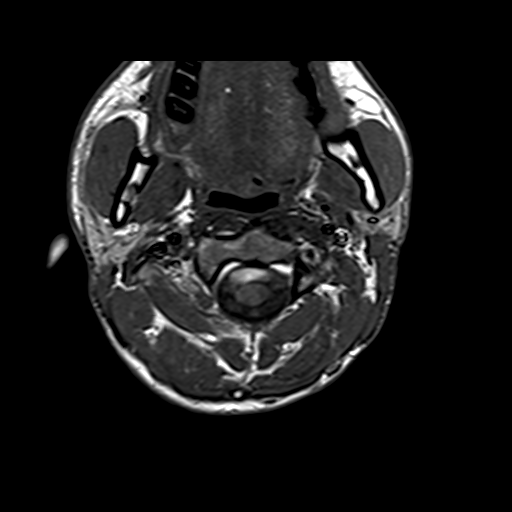

[Series 14: T1 · sagittal · 6.0mm · 1.23mm/px · 1 of 12 slices shown (3 of 5)]
[im 1/12]
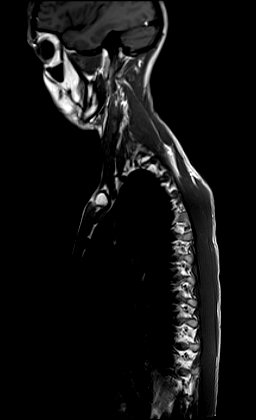

[Series 15: T2 · sagittal · 3.0mm · 0.76mm/px · 2 of 17 slices shown (3 of 4)]
[im 1/17]
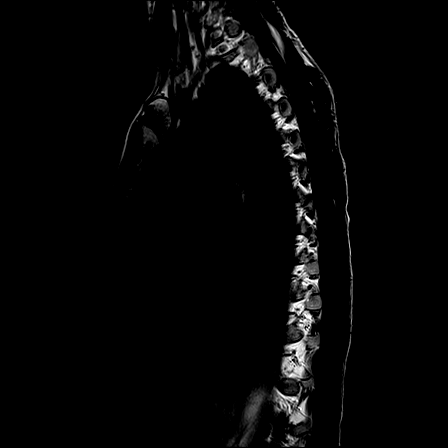
[im 17/17]
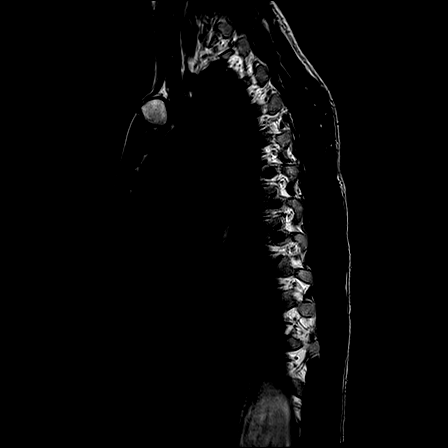

[Series 17: T1 · sagittal · 3.0mm · 0.76mm/px · 2 of 17 slices shown (4 of 5)]
[im 1/17]
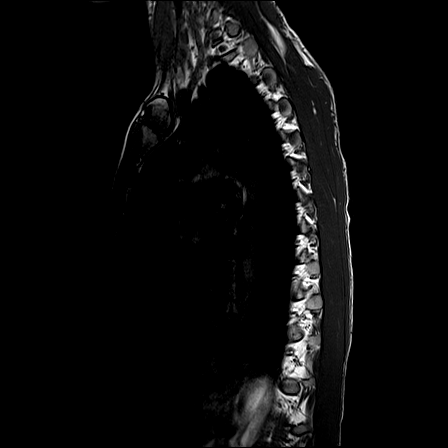
[im 17/17]
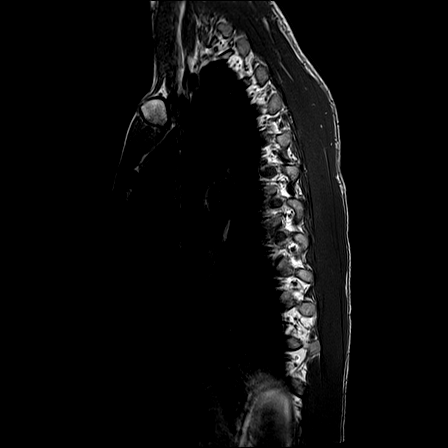

[Series 18: T2 · axial · 4.0mm · 0.59mm/px · z∈[-372,-100]mm · 4 of 40 slices shown (4 of 4)]
[im 1/40]
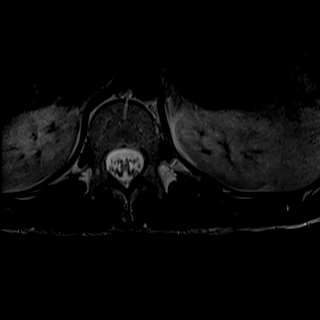
[im 14/40]
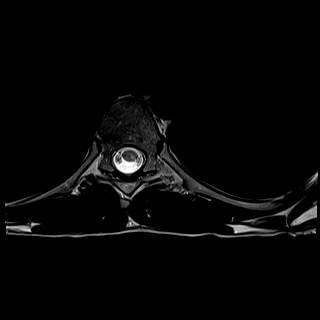
[im 27/40]
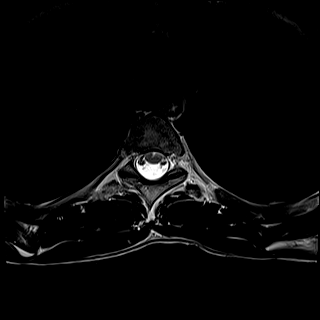
[im 40/40]
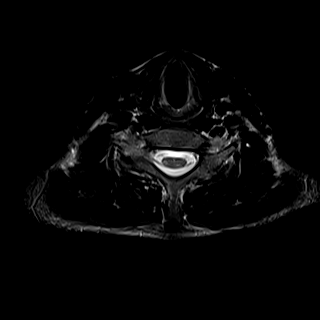

[Series 20: T1 · axial · non-contrast · 4.0mm · 0.31mm/px · z∈[-372,-100]mm · 4 of 40 slices shown (5 of 5)]
[im 1/40]
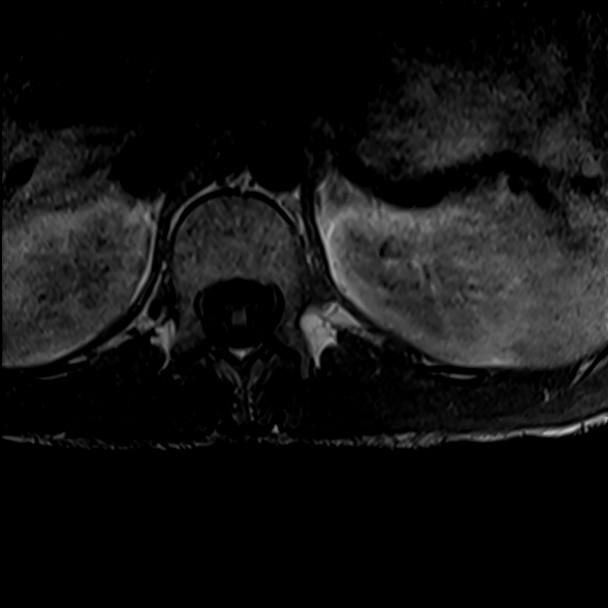
[im 14/40]
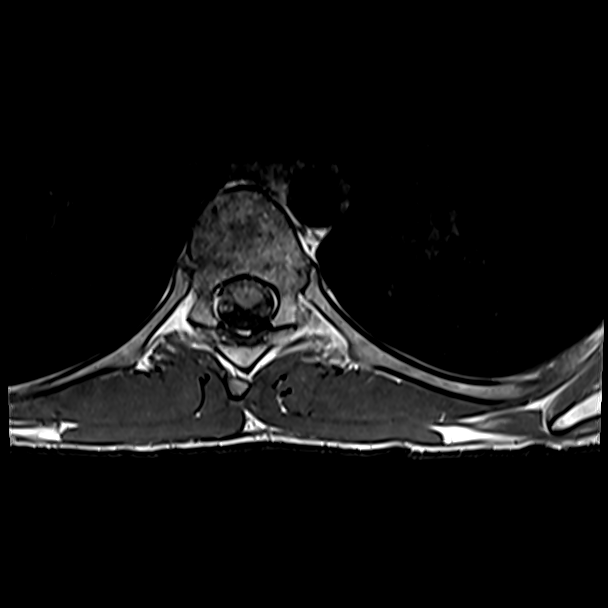
[im 27/40]
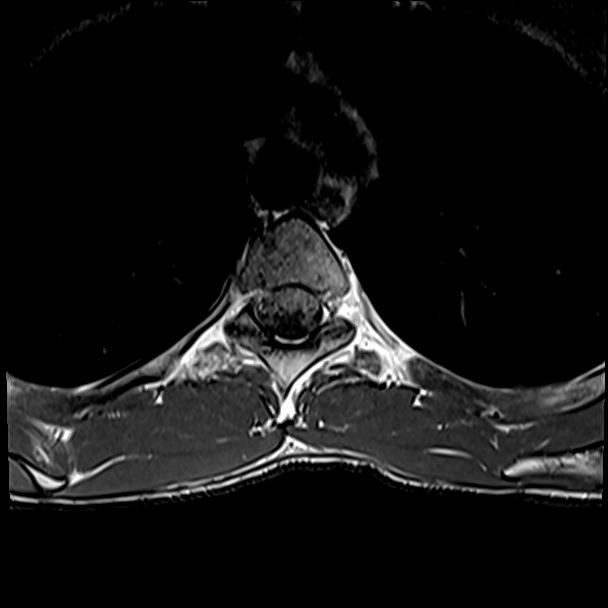
[im 40/40]
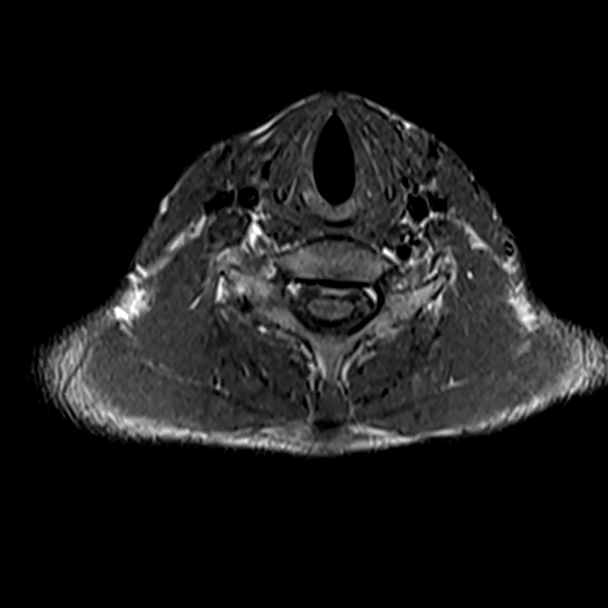

[Series 21: T1 fat-sat post-contrast · sagittal · 3.0mm · 0.76mm/px · 2 of 17 slices shown]
[im 1/17]
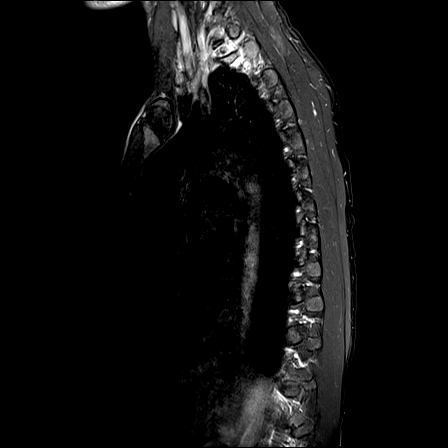
[im 17/17]
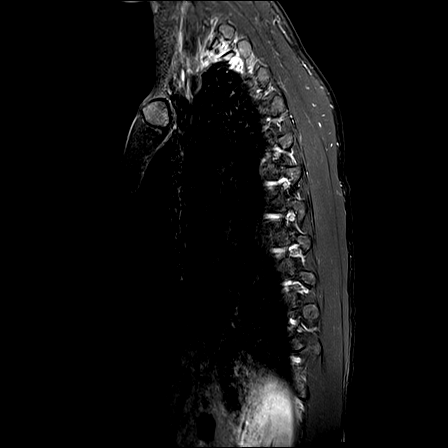

[26 of 48 positions shown; findings below may reference images not displayed]

FINDINGS: MRI CERVICAL SPINE FINDINGS

Alignment: Normal

Vertebrae: Normal bone marrow.  Negative for fracture or mass.

Cord: Extensive spinal cord disease. Numerous ill-defined areas of
hyperintensity in the cord throughout the cervical spine extending
into the upper thoracic spine. No cord expansion. Lesions are
present in the central cord and lateral cord bilaterally. Patchy
enhancement of spinal cord lesion on the right at C5-6. No other
enhancing lesions.

Posterior Fossa, vertebral arteries, paraspinal tissues: Mild patchy
hyperintensity in the medulla.

Disc levels:

Negative for disc protrusion or spinal stenosis. No significant disc
degeneration.

MRI THORACIC SPINE FINDINGS

Alignment:  Normal.  Mild dextroscoliosis.

Vertebrae: Negative for fracture. 5 mm bone marrow lesion T3
vertebral body with hyperintensity on T2 and hypointensity on T1 and
well-circumscribed intense enhancement. Probable lipid poor
hemangioma.

Cord: Multiple cord lesions throughout the thoracic cord most
prominent at C7-T1 with additional smaller lesions throughout the
remainder of the thoracic cord. There is hyperintensity in the conus
medullaris. No enhancing cord lesion identified.

Paraspinal and other soft tissues: Negative for paraspinous mass or
soft tissue edema. No pleural effusion

Disc levels:

No significant disc degeneration or spinal stenosis.
IMPRESSION: Extensive disease in the cervical cord compatible with multiple
sclerosis. Enhancing lesion on the right at C5-6 due to active
demyelinization.

Moderate disease in the thoracic spinal cord with scattered lesions
throughout the cord including the conus medullaris. No enhancing
cord lesions in the thoracic spine.

## 2020-05-30 MED ORDER — VITAMIN D 25 MCG (1000 UNIT) PO TABS
1000.0000 [IU] | ORAL_TABLET | Freq: Every day | ORAL | Status: DC
  Administered 2020-05-30 – 2020-05-31 (×2): 1000 [IU] via ORAL
  Filled 2020-05-30 (×2): qty 1

## 2020-05-30 MED ORDER — CYANOCOBALAMIN 1000 MCG/ML IJ SOLN
1000.0000 ug | Freq: Every day | INTRAMUSCULAR | Status: DC
  Administered 2020-05-30 – 2020-05-31 (×2): 1000 ug via INTRAMUSCULAR
  Filled 2020-05-30 (×2): qty 1

## 2020-05-30 MED ORDER — GADOBUTROL 1 MMOL/ML IV SOLN
6.0000 mL | Freq: Once | INTRAVENOUS | Status: AC | PRN
  Administered 2020-05-30: 6 mL via INTRAVENOUS

## 2020-05-30 NOTE — Plan of Care (Signed)
  Problem: Pain Managment: Goal: General experience of comfort will improve Outcome: Progressing   Problem: Safety: Goal: Ability to remain free from injury will improve Outcome: Progressing   Problem: Skin Integrity: Goal: Risk for impaired skin integrity will decrease Outcome: Progressing   

## 2020-05-30 NOTE — Progress Notes (Signed)
Subjective: No visual symptoms today.   Exam: Vitals:   05/30/20 0812 05/30/20 1404  BP: 120/68 122/72  Pulse: (!) 53 85  Resp: 17 18  Temp: 97.9 F (36.6 C) 98 F (36.7 C)  SpO2: 100% 100%   Gen: In bed, NAD Resp: non-labored breathing, no acute distress Abd: soft, nt  Neuro: MS: awake, alert, interactive and appropriate.  ZO:XWRUE, no APD Motor: no drift, able to touch nose with fingers with eyes close.   Pertinent Labs: B12 324  Impression: 24 yo M with newly diagnosed MS. His MRI looks worse than he does clinically, only mild symptoms but significant disease by MRI. He will need disease modifying therapy and I have referred him to Dr. Epimenio Foot with [facility]. His B12 is borderline and with a family history of deficiency, may be reasonable to supplement, but I do not think it has any bearing on his symptoms or MRI findings.   Recommendations: 1) He can be discharged after 3rd dose of IV solumedrol tomorrow.  2) f/u with outpatient neurology. Please call if any concerns arise prior to discharge.   [name], MD [facility] [phone]  If 7pm- 7am, please page neurology on call as listed in AMION.

## 2020-05-30 NOTE — Progress Notes (Signed)
PROGRESS NOTE                                                                                                                                                                                                             Patient Demographics:    Albert Edwards, is a 24 y.o. male, DOB - Jan 10, 1996, DSK:[id number]  Outpatient Primary MD for the patient is Shelva Majestic, MD    LOS - 1  Admit date - 05/28/2020    Chief Complaint  Patient presents with   Eye Problem       Brief Narrative -  Albert Edwards is a 24 y.o. male with medical history significant of IBS presents with complaints of intermittent blurry vision out of his right eye, he was in good health until about 1 month ago when he was diagnosed with possible BPV, symptoms got better which were of vertigo but now has developed some blurriness in the right eye along with at times mild balance issues, he has strong family history of low B12 deficiency, in the ER MRI suggestive of possible MS.  Seen by neurology and admitted to the hospital.   Subjective:    Delsa Grana today has, No headache, No chest pain, No abdominal pain - No Nausea, No new weakness tingling or numbness, no SOB.   Assessment  & Plan :     1.  Possible new diagnosis of MS.  Case discussed with neurologist Dr. Amada Jupiter, plan is to give 3 days of IV Solu-Medrol, get MRI of the spine thereafter outpatient neurology follow-up.  Currently his visual acuity is intact, no focal deficits, blurriness in the right eye is much improved.    2.  Borderline B12 levels with strong family history of low B12, will check methylmalonic acid and homocysteine levels, for now placed on B12 supplementation.  3.  Low normal vitamin D levels.  Placed on supplementation.     Condition - Fair  Family Communication  :  None  Code Status :  Full  Consults  :  Neuro  Procedures  :   MRI Brain -   Numerous foci of abnormal T2 and FLAIR signal within the cerebral hemispheric white matter, with a distribution and morphology highly suggestive of multiple sclerosis. Single left posterior frontal white matter lesion shows contrast enhancement, indicating active disease  MRI Spine -   PUD Prophylaxis : PPI  Disposition Plan  :    Status is: Inpatient  Remains inpatient appropriate because:IV treatments appropriate due to intensity of illness or inability to take PO   Dispo: The patient is from: Home              Anticipated d/c is to: Home              Anticipated d/c date is: > 3 days              Patient currently is not medically stable to d/c.   DVT Prophylaxis  :  Lovenox    Lab Results  Component Value Date   PLT 182 05/30/2020    Diet :  Diet Order            Diet regular Room service appropriate? Yes; Fluid consistency: Thin  Diet effective now                  Inpatient Medications  Scheduled Meds:  cyanocobalamin  1,000 mcg Intramuscular Daily   enoxaparin (LOVENOX) injection  40 mg Subcutaneous Daily   loratadine  10 mg Oral Daily   pantoprazole  40 mg Oral Daily   sodium chloride flush  3 mL Intravenous Q12H   Continuous Infusions:  methylPREDNISolone (SOLU-MEDROL) injection     PRN Meds:.acetaminophen **OR** acetaminophen, albuterol, ondansetron **OR** ondansetron (ZOFRAN) IV, zolpidem  Antibiotics  :    Anti-infectives (From admission, onward)   None       Time Spent in minutes  30   Susa Raring M.D on 05/30/2020 at 9:53 AM  To page go to [email or web address] - password [facility]  [facility] -  Office  [phone]    See all Orders from today for further details    Objective:   Vitals:   05/29/20 1529 05/29/20 1949 05/30/20 0500 05/30/20 0812  BP: 130/75 135/75 132/70 120/68  Pulse: 84 86 84 (!) 53  Resp: 18 18 17 17   Temp:  98.6 F (37 C) 98.4 F (36.9 C) 97.9 F (36.6 C)  TempSrc:  Oral Axillary Oral  SpO2: 97%  100% 100% 100%  Weight:      Height:        Wt Readings from Last 3 Encounters:  05/29/20 65.8 kg  06/06/19 67.6 kg  06/15/17 66.7 kg (38 %, Z= -0.31)*   * Growth percentiles are based on CDC (Boys, 2-20 Years) data.     Intake/Output Summary (Last 24 hours) at 05/30/2020 0953 Last data filed at 05/29/2020 1738 Gross per 24 hour  Intake 0 ml  Output --  Net 0 ml     Physical Exam  Awake Alert, No new F.N deficits, Normal affect [place].AT,PERRAL Supple Neck,No JVD, No cervical lymphadenopathy appriciated.  Symmetrical Chest wall movement, Good air movement bilaterally, CTAB RRR,No Gallops,Rubs or new Murmurs, No Parasternal Heave +ve B.Sounds, Abd Soft, No tenderness, No organomegaly appriciated, No rebound - guarding or rigidity. No Cyanosis, Clubbing or edema, No new Rash or bruise       Data Review:    CBC Recent Labs  Lab 05/28/20 2230 05/30/20 0546  WBC 6.7 8.4  HGB 16.6 15.4  HCT 49.1 45.2  PLT 190 182  MCV 87.1 88.1  MCH 29.4 30.0  MCHC 33.8 34.1  RDW 12.3 12.4    Recent Labs  Lab 05/28/20 2230 05/30/20 0546  NA 140 138  K 3.7 4.2  CL 103 106  CO2  27 20*  GLUCOSE 100* 119*  BUN 13 19  CREATININE 1.03 0.78  CALCIUM 9.9 9.6    ------------------------------------------------------------------------------------------------------------------ No results for input(s): CHOL, HDL, LDLCALC, TRIG, CHOLHDL, LDLDIRECT in the last 72 hours.  No results found for: HGBA1C ------------------------------------------------------------------------------------------------------------------ No results for input(s): TSH, T4TOTAL, T3FREE, THYROIDAB in the last 72 hours.  Invalid input(s): FREET3  Cardiac Enzymes No results for input(s): CKMB, TROPONINI, MYOGLOBIN in the last 168 hours.  Invalid input(s): CK ------------------------------------------------------------------------------------------------------------------ No results found for: BNP  Micro  Results Recent Results (from the past 240 hour(s))  SARS Coronavirus 2 by RT PCR (hospital order, performed in [facility] hospital lab) Nasopharyngeal Nasopharyngeal Swab     Status: None   Collection Time: 05/29/20  9:07 AM   Specimen: Nasopharyngeal Swab  Result Value Ref Range Status   SARS Coronavirus 2 NEGATIVE NEGATIVE Final    Comment: (NOTE) SARS-CoV-2 target nucleic acids are NOT DETECTED.  The SARS-CoV-2 RNA is generally detectable in upper and lower respiratory specimens during the acute phase of infection. The lowest concentration of SARS-CoV-2 viral copies this assay can detect is 250 copies / mL. A negative result does not preclude SARS-CoV-2 infection and should not be used as the sole basis for treatment or other patient management decisions.  A negative result may occur with improper specimen collection / handling, submission of specimen other than nasopharyngeal swab, presence of viral mutation(s) within the areas targeted by this assay, and inadequate number of viral copies (<250 copies / mL). A negative result must be combined with clinical observations, patient history, and epidemiological information.  Fact Sheet for Patients:   BoilerBrush.com.cy  Fact Sheet for Healthcare Providers: https://pope.com/  This test is not yet approved or  cleared by the [place] FDA and has been authorized for detection and/or diagnosis of SARS-CoV-2 by FDA under an Emergency Use Authorization (EUA).  This EUA will remain in effect (meaning this test can be used) for the duration of the COVID-19 declaration under Section 564(b)(1) of the Act, 21 U.S.C. section 360bbb-3(b)(1), unless the authorization is terminated or revoked sooner.  Performed at [facility], [address] [place], [place] [address]     Radiology Reports MR Brain W Wo Contrast  Result Date: 05/28/2020 CLINICAL DATA:  Monocular vision loss on  the right with exertion. EXAM: MRI HEAD WITHOUT AND WITH CONTRAST TECHNIQUE: Multiplanar, multiecho pulse sequences of the brain and surrounding structures were obtained without and with intravenous contrast. CONTRAST:  12mL MULTIHANCE GADOBENATE DIMEGLUMINE 529 MG/ML IV SOLN COMPARISON:  None. FINDINGS: Brain: There are numerous foci of abnormal T2 and FLAIR signal within the hemispheric white matter, with a distribution and morphology highly suggestive of multiple sclerosis. No lesion is seen affecting the brainstem, there is minor involvement of the cerebellum in the region of the left middle cerebellar peduncle. Cerebral hemispheres show approximately 20-25 lesions within each hemisphere. None show true restricted diffusion. There is abnormal enhancement associated with a left posterior frontal deep white matter lesion. No evidence of mass, hemorrhage, hydrocephalus or extra-axial collection. Vascular: Major vessels at the base of the brain show flow. Skull and upper cervical spine: Negative Sinuses/Orbits: Sinuses are clear. No orbital pathology is seen. Dedicated orbital exam was not ordered or performed, but I do not see evidence of optic neuritis. Both globes appear normal. Orbital apices appear normal. Other: None IMPRESSION: Numerous foci of abnormal T2 and FLAIR signal within the cerebral hemispheric white matter, with a distribution and morphology highly suggestive  of multiple sclerosis. Single left posterior frontal white matter lesion shows contrast enhancement, indicating active disease. Electronically Signed   By: Paulina Fusi M.D.   On: 05/28/2020 14:19

## 2020-05-31 LAB — SJOGRENS SYNDROME-A EXTRACTABLE NUCLEAR ANTIBODY: SSA (Ro) (ENA) Antibody, IgG: <0.2 AI (ref 0.0–0.9)

## 2020-05-31 LAB — SJOGRENS SYNDROME-B EXTRACTABLE NUCLEAR ANTIBODY: SSB (La) (ENA) Antibody, IgG: <0.2 AI (ref 0.0–0.9)

## 2020-05-31 MED ORDER — VITAMIN D3 25 MCG PO TABS
1000.0000 [IU] | ORAL_TABLET | Freq: Every day | ORAL | 0 refills | Status: DC
Start: 2020-06-01 — End: 2020-06-03

## 2020-05-31 MED ORDER — VITAMIN B-12 1000 MCG PO TABS: 1000.0000 ug | ORAL_TABLET | Freq: Every day | ORAL | 0 refills | Status: DC

## 2020-05-31 NOTE — Progress Notes (Signed)
Pt IV removed, AVS reviewed, and belongings gathered. All questions answered to pt and family satisfaction. Pt dressed and wheeled outside to family car for discharge.

## 2020-05-31 NOTE — Plan of Care (Signed)

## 2020-05-31 NOTE — Discharge Summary (Signed)
Albert Edwards KCM:[id number] DOB: [birth year] DOA: 05/28/2020  PCP: Shelva Majestic, MD  Admit date: 05/28/2020  Discharge date: 05/31/2020  Admitted From: Home   Disposition:  Home   Recommendations for Outpatient Follow-up:   Follow up with PCP in 1-2 weeks  PCP Please obtain BMP/CBC, 2 view CXR in 1week,  (see Discharge instructions)   PCP Please follow up on the following pending results: Follow pending homocysteine and Mikhail melanotic acid levels  Home Health: None Equipment/Devices: None  Consultations: Neuro Discharge Condition: Stable    CODE STATUS: Full    Diet Recommendation: Heart Healthy   Diet Order            Diet - low sodium heart healthy           Diet regular Room service appropriate? Yes; Fluid consistency: Thin  Diet effective now                  Chief Complaint  Patient presents with  . Eye Problem     Brief history of present illness from the day of admission and additional interim summary    Albert Milne Kordsmeieris a 24 y.o.malewith medical history significant ofIBSpresents with complaints of intermittent blurry vision out of his right eye, he was in good health until about 1 month ago when he was diagnosed with possible BPV, symptoms got better which were of vertigo but now has developed some blurriness in the right eye along with at times mild balance issues, he has strong family history of low B12 deficiency, work-up and brain, spine MRI suggestive of MS.                                                                  Hospital Course   1.  Possible new diagnosis of MS with signs of acute flare.  Case discussed with neurologist Dr. Amada Jupiter, plan is to give 3 doses of IV Solu-Medrol which she is finishing today, currently no residual signs or symptoms,  thereafter follow with neurology outpatient, no driving restrictions per Dr. Amada Jupiter.   2.  Borderline B12 levels with strong family history of low B12, pending methylmalonic acid and homocysteine levels, for now placed on B12 supplementation.  Request PCP to monitor.  3.  Low normal vitamin D levels.  Placed on supplementation.  Request PCP to monitor     Discharge diagnosis     Active Problems:   Multiple sclerosis exacerbation [facility])    Discharge instructions    Discharge Instructions    Ambulatory referral to Neurology   Complete by: As directed    An appointment is requested in approximately: 1 week   Diet - low sodium heart healthy   Complete by: As directed    Discharge instructions   Complete by: As  directed    You were cared for in the hospital by Dr. Thedore Mins.    Follow with Primary MD Shelva Majestic, MD in 7 days   Get CBC, CMP, B12, methylmalonic acid, homocystine levels, results of Sjogren's syndrome serology which is pending checked next visit within 1 week by Primary MD    Activity: As tolerated with Full fall precautions use walker/cane & assistance as needed  Disposition Home    Diet: Heart Healthy    Special Instructions: If you have smoked or chewed Tobacco  in the last 2 yrs please stop smoking, stop any regular Alcohol  and or any Recreational drug use.  On your next visit with your primary care physician please Get Medicines reviewed and adjusted.  Please request your Prim.MD to go over all Hospital Tests and Procedure/Radiological results at the follow up, please get all Hospital records sent to your Prim MD by signing hospital release before you go home.  If you experience worsening of your admission symptoms, develop shortness of breath, life threatening emergency, suicidal or homicidal thoughts you must seek medical attention immediately by calling 911 or calling your MD immediately  if symptoms less severe.  You Must read complete  instructions/literature along with all the possible adverse reactions/side effects for all the Medicines you take and that have been prescribed to you. Take any new Medicines after you have completely understood and accpet all the possible adverse reactions/side effects.   Increase activity slowly   Complete by: As directed       Discharge Medications   Allergies as of 05/31/2020   No Known Allergies     Medication List    TAKE these medications   acetaminophen 500 MG tablet Commonly known as: TYLENOL Take 500 mg by mouth every 6 (six) hours as needed for moderate pain.   fexofenadine 180 MG tablet Commonly known as: ALLEGRA Take 180 mg by mouth daily.   hyoscyamine 0.125 MG tablet Commonly known as: LEVSIN Take 1 tablet (0.125 mg total) by mouth every 4 (four) hours as needed (for abdominal cramping. 4 doses a day maximum.).   meclizine 12.5 MG tablet Commonly known as: ANTIVERT Take 1-2 tablets (12.5-25 mg total) by mouth 3 (three) times daily as needed for dizziness. Albert Edwards   vitamin B-12 1000 MCG tablet Commonly known as: CYANOCOBALAMIN Take 1 tablet (1,000 mcg total) by mouth daily.   Vitamin D3 25 MCG tablet Commonly known as: Vitamin D Take 1 tablet (1,000 Units total) by mouth daily. Start taking on: June 01, 2020        Follow-up Information    Shelva Majestic, MD. Schedule an appointment as soon as possible for a visit in 1 week(s).   Specialty: Family Medicine Contact information: [address] [place] [address] [phone]        [facility]. Schedule an appointment as soon as possible for a visit in 1 week(s).   Why: New diagnosis of MS Contact information: [address]     [address] [place] [address] [phone]              Major procedures and Radiology Reports - PLEASE review detailed and final reports thoroughly  -      MR Brain W Wo Contrast  Result Date:  05/28/2020 CLINICAL DATA:  Monocular vision loss on the right with exertion. EXAM: MRI HEAD WITHOUT AND WITH CONTRAST TECHNIQUE: Multiplanar, multiecho pulse sequences of the brain and surrounding structures were obtained  without and with intravenous contrast. CONTRAST:  15mL MULTIHANCE GADOBENATE DIMEGLUMINE 529 MG/ML IV SOLN COMPARISON:  None. FINDINGS: Brain: There are numerous foci of abnormal T2 and FLAIR signal within the hemispheric white matter, with a distribution and morphology highly suggestive of multiple sclerosis. No lesion is seen affecting the brainstem, there is minor involvement of the cerebellum in the region of the left middle cerebellar peduncle. Cerebral hemispheres show approximately 20-25 lesions within each hemisphere. None show true restricted diffusion. There is abnormal enhancement associated with a left posterior frontal deep white matter lesion. No evidence of mass, hemorrhage, hydrocephalus or extra-axial collection. Vascular: Major vessels at the base of the brain show flow. Skull and upper cervical spine: Negative Sinuses/Orbits: Sinuses are clear. No orbital pathology is seen. Dedicated orbital exam was not ordered or performed, but I do not see evidence of optic neuritis. Both globes appear normal. Orbital apices appear normal. Other: None IMPRESSION: Numerous foci of abnormal T2 and FLAIR signal within the cerebral hemispheric white matter, with a distribution and morphology highly suggestive of multiple sclerosis. Single left posterior frontal white matter lesion shows contrast enhancement, indicating active disease. Electronically Signed   By: Paulina Fusi M.D.   On: 05/28/2020 14:19   MR CERVICAL SPINE W WO CONTRAST  Result Date: 05/30/2020 CLINICAL DATA:  Acute or progressive myelopathy. MRI brain highly suggestive of multiple sclerosis. EXAM: MRI CERVICAL AND THORACIC SPINE WITHOUT AND WITH CONTRAST TECHNIQUE: Multiplanar and multiecho pulse sequences of the cervical  spine, to include the craniocervical junction and cervicothoracic junction, and the thoracic spine, were obtained without and with intravenous contrast. CONTRAST:  6mL GADAVIST GADOBUTROL 1 MMOL/ML IV SOLN COMPARISON:  MRI head 05/28/2020 FINDINGS: MRI CERVICAL SPINE FINDINGS Alignment: Normal Vertebrae: Normal bone marrow.  Negative for fracture or mass. Cord: Extensive spinal cord disease. Numerous ill-defined areas of hyperintensity in the cord throughout the cervical spine extending into the upper thoracic spine. No cord expansion. Lesions are present in the central cord and lateral cord bilaterally. Patchy enhancement of spinal cord lesion on the right at C5-6. No other enhancing lesions. Posterior Fossa, vertebral arteries, paraspinal tissues: Mild patchy hyperintensity in the medulla. Disc levels: Negative for disc protrusion or spinal stenosis. No significant disc degeneration. MRI THORACIC SPINE FINDINGS Alignment:  Normal.  Mild dextroscoliosis. Vertebrae: Negative for fracture. 5 mm bone marrow lesion T3 vertebral body with hyperintensity on T2 and hypointensity on T1 and well-circumscribed intense enhancement. Probable lipid poor hemangioma. Cord: Multiple cord lesions throughout the thoracic cord most prominent at C7-T1 with additional smaller lesions throughout the remainder of the thoracic cord. There is hyperintensity in the conus medullaris. No enhancing cord lesion identified. Paraspinal and other soft tissues: Negative for paraspinous mass or soft tissue edema. No pleural effusion Disc levels: No significant disc degeneration or spinal stenosis. IMPRESSION: Extensive disease in the cervical cord compatible with multiple sclerosis. Enhancing lesion on the right at C5-6 due to active demyelinization. Moderate disease in the thoracic spinal cord with scattered lesions throughout the cord including the conus medullaris. No enhancing cord lesions in the thoracic spine. Electronically Signed   By:  Marlan Palau M.D.   On: 05/30/2020 11:30   MR THORACIC SPINE W WO CONTRAST  Result Date: 05/30/2020 CLINICAL DATA:  Acute or progressive myelopathy. MRI brain highly suggestive of multiple sclerosis. EXAM: MRI CERVICAL AND THORACIC SPINE WITHOUT AND WITH CONTRAST TECHNIQUE: Multiplanar and multiecho pulse sequences of the cervical spine, to include the craniocervical junction and cervicothoracic junction, and the  thoracic spine, were obtained without and with intravenous contrast. CONTRAST:  60mL GADAVIST GADOBUTROL 1 MMOL/ML IV SOLN COMPARISON:  MRI head 05/28/2020 FINDINGS: MRI CERVICAL SPINE FINDINGS Alignment: Normal Vertebrae: Normal bone marrow.  Negative for fracture or mass. Cord: Extensive spinal cord disease. Numerous ill-defined areas of hyperintensity in the cord throughout the cervical spine extending into the upper thoracic spine. No cord expansion. Lesions are present in the central cord and lateral cord bilaterally. Patchy enhancement of spinal cord lesion on the right at C5-6. No other enhancing lesions. Posterior Fossa, vertebral arteries, paraspinal tissues: Mild patchy hyperintensity in the medulla. Disc levels: Negative for disc protrusion or spinal stenosis. No significant disc degeneration. MRI THORACIC SPINE FINDINGS Alignment:  Normal.  Mild dextroscoliosis. Vertebrae: Negative for fracture. 5 mm bone marrow lesion T3 vertebral body with hyperintensity on T2 and hypointensity on T1 and well-circumscribed intense enhancement. Probable lipid poor hemangioma. Cord: Multiple cord lesions throughout the thoracic cord most prominent at C7-T1 with additional smaller lesions throughout the remainder of the thoracic cord. There is hyperintensity in the conus medullaris. No enhancing cord lesion identified. Paraspinal and other soft tissues: Negative for paraspinous mass or soft tissue edema. No pleural effusion Disc levels: No significant disc degeneration or spinal stenosis. IMPRESSION:  Extensive disease in the cervical cord compatible with multiple sclerosis. Enhancing lesion on the right at C5-6 due to active demyelinization. Moderate disease in the thoracic spinal cord with scattered lesions throughout the cord including the conus medullaris. No enhancing cord lesions in the thoracic spine. Electronically Signed   By: Marlan Palau M.D.   On: 05/30/2020 11:30    Micro Results     Recent Results (from the past 240 hour(s))  SARS Coronavirus 2 by RT PCR (hospital order, performed in [facility] hospital lab) Nasopharyngeal Nasopharyngeal Swab     Status: None   Collection Time: 05/29/20  9:07 AM   Specimen: Nasopharyngeal Swab  Result Value Ref Range Status   SARS Coronavirus 2 NEGATIVE NEGATIVE Final    Comment: (NOTE) SARS-CoV-2 target nucleic acids are NOT DETECTED.  The SARS-CoV-2 RNA is generally detectable in upper and lower respiratory specimens during the acute phase of infection. The lowest concentration of SARS-CoV-2 viral copies this assay can detect is 250 copies / mL. A negative result does not preclude SARS-CoV-2 infection and should not be used as the sole basis for treatment or other patient management decisions.  A negative result may occur with improper specimen collection / handling, submission of specimen other than nasopharyngeal swab, presence of viral mutation(s) within the areas targeted by this assay, and inadequate number of viral copies (<250 copies / mL). A negative result must be combined with clinical observations, patient history, and epidemiological information.  Fact Sheet for Patients:   BoilerBrush.com.cy  Fact Sheet for Healthcare Providers: https://pope.com/  This test is not yet approved or  cleared by the [place] FDA and has been authorized for detection and/or diagnosis of SARS-CoV-2 by FDA under an Emergency Use Authorization (EUA).  This EUA will remain in effect  (meaning this test can be used) for the duration of the COVID-19 declaration under Section 564(b)(1) of the Act, 21 U.S.C. section 360bbb-3(b)(1), unless the authorization is terminated or revoked sooner.  Performed at [facility], [address], [place], [place] [address]     Today   Subjective    Albert Edwards today has no headache,no chest abdominal pain,no new weakness tingling or numbness, feels much better wants to  go home today.     Objective   Blood pressure 130/76, pulse 78, temperature 98.7 F (37.1 C), temperature source Oral, resp. rate 16, height 5\' 11"  (1.803 m), weight 65.8 kg, SpO2 100 %.   Intake/Output Summary (Last 24 hours) at 05/31/2020 1009 Last data filed at 05/30/2020 1300 Gross per 24 hour  Intake 240 ml  Output --  Net 240 ml    Exam  Awake Alert, No new F.N deficits, Normal affect [place].AT,PERRAL Supple Neck,No JVD, No cervical lymphadenopathy appriciated.  Symmetrical Chest wall movement, Good air movement bilaterally, CTAB RRR,No Gallops,Rubs or new Murmurs, No Parasternal Heave +ve B.Sounds, Abd Soft, Non tender, No organomegaly appriciated, No rebound -guarding or rigidity. No Cyanosis, Clubbing or edema, No new Rash or bruise   Data Review   CBC w Diff:  Lab Results  Component Value Date   WBC 8.4 05/30/2020   HGB 15.4 05/30/2020   HCT 45.2 05/30/2020   PLT 182 05/30/2020    CMP:  Lab Results  Component Value Date   NA 138 05/30/2020   K 4.2 05/30/2020   CL 106 05/30/2020   CO2 20 (L) 05/30/2020   BUN 19 05/30/2020   CREATININE 0.78 05/30/2020   PROT 6.9 06/06/2019   ALBUMIN 5.0 06/06/2019   BILITOT 2.3 (H) 06/06/2019   ALKPHOS 59 06/06/2019   AST 16 06/06/2019   ALT 12 06/06/2019  .   Total Time in preparing paper work, data evaluation and todays exam - 35 minutes  06/08/2019 M.D on 05/31/2020 at 10:09 AM  [facility]   Office  [phone]

## 2020-05-31 NOTE — Plan of Care (Signed)
  Problem: Pain Managment: Goal: General experience of comfort will improve Outcome: Progressing   Problem: Safety: Goal: Ability to remain free from injury will improve Outcome: Progressing   Problem: Skin Integrity: Goal: Risk for impaired skin integrity will decrease Outcome: Progressing   

## 2020-05-31 NOTE — Discharge Instructions (Signed)
You were cared for in the hospital by Dr. Thedore Mins.    Follow with Primary MD Shelva Majestic, MD in 7 days   Get CBC, CMP, B12, methylmalonic acid, homocystine levels, results of Sjogren's syndrome serology which is pending checked next visit within 1 week by Primary MD    Activity: As tolerated with Full fall precautions use walker/cane & assistance as needed  Disposition Home    Diet: Heart Healthy    Special Instructions: If you have smoked or chewed Tobacco  in the last 2 yrs please stop smoking, stop any regular Alcohol  and or any Recreational drug use.  On your next visit with your primary care physician please Get Medicines reviewed and adjusted.  Please request your Prim.MD to go over all Hospital Tests and Procedure/Radiological results at the follow up, please get all Hospital records sent to your Prim MD by signing hospital release before you go home.  If you experience worsening of your admission symptoms, develop shortness of breath, life threatening emergency, suicidal or homicidal thoughts you must seek medical attention immediately by calling 911 or calling your MD immediately  if symptoms less severe.  You Must read complete instructions/literature along with all the possible adverse reactions/side effects for all the Medicines you take and that have been prescribed to you. Take any new Medicines after you have completely understood and accpet all the possible adverse reactions/side effects.

## 2020-06-01 LAB — HOMOCYSTEINE

## 2020-06-02 ENCOUNTER — Encounter: Payer: Self-pay | Admitting: Neurology

## 2020-06-02 ENCOUNTER — Other Ambulatory Visit: Payer: Self-pay

## 2020-06-02 ENCOUNTER — Encounter: Payer: Self-pay | Admitting: Family Medicine

## 2020-06-02 ENCOUNTER — Telehealth: Payer: Self-pay | Admitting: *Deleted

## 2020-06-02 ENCOUNTER — Ambulatory Visit: Payer: BC Managed Care – PPO | Admitting: Neurology

## 2020-06-02 VITALS — BP 137/73 | HR 76 | Ht 71.0 in | Wt 147.5 lb

## 2020-06-02 DIAGNOSIS — Z79899 Other long term (current) drug therapy: Secondary | ICD-10-CM | POA: Diagnosis not present

## 2020-06-02 DIAGNOSIS — H469 Unspecified optic neuritis: Secondary | ICD-10-CM

## 2020-06-02 DIAGNOSIS — G35 Multiple sclerosis: Secondary | ICD-10-CM

## 2020-06-02 NOTE — Progress Notes (Signed)
[facility]  PATIENT: Albert Edwards DOB: 1996/01/31  REFERRING DOCTOR OR PCP:  [name] / [name] SOURCE: Patient, notes from hospitalization and PCP, imaging laboratory reports, MRI images personally reviewed and interpreted.  _________________________________   HISTORICAL  CHIEF COMPLAINT:  Chief Complaint  Patient presents with  . New Patient (Initial Visit)    RM 78 with mother, Amil Amen. Hospital referral for MS. He was at [facility] 05/28/20-05/31/20. Presented with right eye blurriness, balance issues. Received 3 days IV steroids. Had MRI's done while there.  Balance issues have improved. It was never severe per pt.     HISTORY OF PRESENT ILLNESS:  I had the pleasure of seeing your patient, Albert Edwards, at the [facility] at [facility] for neurologic consultation regarding his recent diagnosis of relapsing remitting multiple sclerosis.  He is a 24 year old man who noted vertigo in August that improved with meclizine.   Shortly thereafter, he noted blurry vision while running in early September.   He had an MRI of the brain performed 05/28/2020 which showed multiple foci c/w MS, some that enhanced.    He had noted minimal balance issues off/on over the last year but was going up and down stairs without issues and carried on all his normal activities.   He had no numbness, tingling or weakness.   He had no bladder vision.  After the abnormal brain MRI, he was admitted to [facility] 05/29/19 and received 3 days of IV Solu-Medrol.  Additionally, he had MRI of the cervical thoracic spine showing additional lesions including 1 enhancing lesion to the right adjacent to C6.  He tolerated the steroid well and felt some improvement of symptoms.  He has not had any new symptoms since that time.  No FH of MS,   His father had B12 deficiency and needed treatment.    He is otherwise in good health.     He notes minimal fatigue and  does everything he needs to do.   He sleeps well.   He notes no cognitive issues.  No mood issues.  He is engaged.   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      Multiple MRI images were personally reviewed. MRI brain 05/28/2020 shows multiple T2/FLAIR hyperintense foci.  Foci are present in the left middle cerebellar peduncle, cerebellar vermis, and in the periventricular, juxtacortical deep white matter of the hemispheres.  Several foci enhance after contrast.  The largest is in the posterior left frontal lobe.  There are also a few punctate enhancing foci in the hemispheres including a periventricular enhancing focus on the right and a juxtacortical focus in the left parietal lobe.  MRI of the cervical spine 05/30/2020 showed an enhancing focus adjacent to C6 another T2 hyperintense foci that are chronic.  These are located anteriorly adjacent to C3-C4, posterior laterally to the right adjacent to C4, posterior laterally to the left adjacent to C5, anteriorly adjacent to C5, laterally to the right adjacent to C6 (enhancing consistent with acute focus), posterior laterally to the left adjacent to C7, centrally adjacent to T1,  MRI of the thoracic spine 05/30/2020 shows several T2 hyperintense foci consistent with chronic demyelinating plaque.  No enhancing lesions.   These are located centrally adjacent to T1, posterolaterally to the left adjacent to T9,  Laboratory tests 05/28/2020 through 05/30/2020 show negative PCR test for SARS-CoV-2.  HIV negative.  SSA/SSB negative, Vitamin D was 38.    REVIEW  OF SYSTEMS: Constitutional: No fevers, chills, sweats, or change in appetite Eyes: No visual changes, double vision, eye pain Ear, nose and throat: No hearing loss, ear pain, nasal congestion, sore throat Cardiovascular: No chest pain, palpitations Respiratory: No shortness of breath at rest or with exertion.   No wheezes GastrointestinaI: No nausea, vomiting, diarrhea, abdominal  pain, fecal incontinence Genitourinary: No dysuria, urinary retention or frequency.  No nocturia. Musculoskeletal: No neck pain, back pain Integumentary: No rash, pruritus, skin lesions Neurological: as above Psychiatric: No depression at this time.  No anxiety Endocrine: No palpitations, diaphoresis, change in appetite, change in weigh or increased thirst Hematologic/Lymphatic: No anemia, purpura, petechiae. Allergic/Immunologic: No itchy/runny eyes, nasal congestion, recent allergic reactions, rashes  ALLERGIES: No Known Allergies  HOME MEDICATIONS:  Current Outpatient Medications:  .  acetaminophen (TYLENOL) 500 MG tablet, Take 500 mg by mouth every 6 (six) hours as needed for moderate pain., Disp: , Rfl:  .  cholecalciferol (VITAMIN D) 25 MCG tablet, Take 1 tablet (1,000 Units total) by mouth daily., Disp: 30 tablet, Rfl: 0 .  fexofenadine (ALLEGRA) 180 MG tablet, Take 180 mg by mouth daily., Disp: , Rfl:  .  hyoscyamine (LEVSIN) 0.125 MG tablet, Take 1 tablet (0.125 mg total) by mouth every 4 (four) hours as needed (for abdominal cramping. 4 doses a day maximum.)., Disp: 90 tablet, Rfl: 5 .  meclizine (ANTIVERT) 12.5 MG tablet, Take 1-2 tablets (12.5-25 mg total) by mouth 3 (three) times daily as needed for dizziness. Tristan Schroeder, Disp: 40 tablet, Rfl: 0 .  vitamin B-12 (CYANOCOBALAMIN) 1000 MCG tablet, Take 1 tablet (1,000 mcg total) by mouth daily., Disp: 30 tablet, Rfl: 0  PAST MEDICAL HISTORY: Past Medical History:  Diagnosis Date  . IBS (irritable colon syndrome)    Constipation predominant. Constipation- with BM abdominal pain improves. Comes in waves with higher stress. worse with less exercise. Plan: increase exercise, watch FODMAP, consider metamucil if that doesnt work  . Multiple sclerosis (HCC)     PAST SURGICAL HISTORY: Past Surgical History:  Procedure Laterality Date  . ADENOIDECTOMY     2006  . TONSILLECTOMY      FAMILY HISTORY: Family History  Problem  Relation Age of Onset  . Healthy Mother        and father  . Hyperlipidemia Other        grandparent  . Hypertension Other        grandparent  . Diabetes Other        grandparent  . Hypothyroidism Other   . ADD / ADHD Brother   . Other Father        Vit B12 def    SOCIAL HISTORY:  Social History   Socioeconomic History  . Marital status: Single    Spouse name: Not on file  . Number of children: Not on file  . Years of education: Not on file  . Highest education level: Not on file  Occupational History  . Not on file  Tobacco Use  . Smoking status: Never Smoker  . Smokeless tobacco: Never Used  Substance and Sexual Activity  . Alcohol use: Yes    Alcohol/week: 0.0 standard drinks    Comment: 1-2 per week  . Drug use: No  . Sexual activity: Not on file  Other Topics Concern  . Not on file  Social History Narrative   Family: Single. Not dating right now.       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  [employer]- for Programmer, multimedia 2020   [employer]- [employer]. 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 Difficulty of Paying Living Expenses: Not on file  Food Insecurity:   . Worried About Programme researcher, broadcasting/film/video in the Last Year: Not on file  . Ran Out of Food in the Last Year: Not on file  Transportation Needs:   . Lack of Transportation (Medical): Not on file  . Lack of Transportation (Non-Medical): Not on file  Physical Activity:   . Days of Exercise per Week: Not on file  . Minutes of Exercise per Session: Not on file  Stress:   . Feeling of Stress : Not on file  Social Connections:   . Frequency  of Communication with Friends and Family: Not on file  . Frequency of Social Gatherings with Friends and Family: Not on file  . Attends Religious Services: Not on file  . Active Member of Clubs or Organizations: Not on file  . Attends Banker Meetings: Not on file  . Marital Status: Not on file  Intimate Partner Violence:   . Fear of Current or Ex-Partner: Not on file  . Emotionally Abused: Not on file  . Physically Abused: Not on file  . Sexually Abused: Not on file     PHYSICAL EXAM  Vitals:   06/02/20 0841  BP: 137/73  Pulse: 76  Weight: 147 lb 8 oz (66.9 kg)  Height: 5\' 11"  (1.803 m)    Body mass index is 20.57 kg/m.   General: The patient is well-developed and well-nourished and in no acute distress  HEENT:  Head is [place]/AT.  Sclera are anicteric.  Funduscopic exam shows normal optic discs and retinal vessels.  Neck: No carotid bruits are noted.  The neck is nontender.  Cardiovascular: The heart has a regular rate and rhythm with a normal S1 and S2. There were no murmurs, gallops or rubs.    Skin: Extremities are without rash or  edema.  Musculoskeletal:  Back is nontender  Neurologic Exam  Mental status: The patient is alert and oriented x 3 at the time of the examination. The patient has apparent normal recent and remote memory, with an apparently normal attention span and concentration ability.   Speech is normal.  Cranial nerves: Extraocular movements are full. Pupils are equal, round, and reactive to light and accomodation.  Visual fields are full.  Facial symmetry is present. There is good facial sensation to soft touch bilaterally.Facial strength is normal.  Trapezius and sternocleidomastoid strength is normal. No dysarthria is noted.  The tongue is midline, and the patient has symmetric elevation of the soft palate. No obvious hearing deficits are noted.  Motor:  Muscle bulk is normal.   Tone is normal. Strength is  5 / 5 in all 4 extremities.    Sensory: Sensory testing is intact to pinprick, soft touch and vibration sensation in all 4 extremities.  Coordination: Cerebellar testing reveals good finger-nose-finger and heel-to-shin bilaterally.  Gait and station: Station is normal.   Gait is normal. Tandem gait is mildly wide. Romberg is negative.  Reflexes: Deep tendon reflexes are symmetric but increased, 3 in the arms and 3+ at the knees with spread and 1 beat of nonsustained clonus at the ankles.   Plantar responses are flexor.    DIAGNOSTIC DATA (LABS, IMAGING, TESTING) - I reviewed patient records, labs, notes, testing and imaging myself where available.  Lab Results  Component Value Date   WBC 8.4 05/30/2020   HGB 15.4 05/30/2020   HCT 45.2 05/30/2020   MCV 88.1 05/30/2020   PLT 182 05/30/2020      Component Value Date/Time   NA 138 05/30/2020 0546   K 4.2 05/30/2020 0546   CL 106 05/30/2020 0546   CO2 20 (L) 05/30/2020 0546   GLUCOSE 119 (H) 05/30/2020 0546   BUN 19 05/30/2020 0546   CREATININE 0.78 05/30/2020 0546   CALCIUM 9.6 05/30/2020 0546   PROT 6.9 06/06/2019 0835   ALBUMIN 5.0 06/06/2019 0835   AST 16 06/06/2019 0835   ALT 12 06/06/2019 0835   ALKPHOS 59 06/06/2019 0835   BILITOT 2.3 (H) 06/06/2019 0835   GFRNONAA >60 05/30/2020 0546   GFRAA >60 05/30/2020 0546   Lab Results  Component Value Date   CHOL 114 06/06/2019   HDL 44.20 06/06/2019   LDLCALC 59 06/06/2019   TRIG 50.0 06/06/2019   CHOLHDL 3 06/06/2019   No results found for: HGBA1C Lab Results  Component Value Date   VITAMINB12 324 05/29/2020   No results found for: TSH     ASSESSMENT AND PLAN  Multiple sclerosis, relapsing-remitting (HCC) - Plan: Hepatitis B core antibody, total, Hepatitis B surface antigen, QuantiFERON-TB Gold Plus, IgG, IgA, IgM, Hepatitis B surface antibody,qualitative, Stratify JCV Antibody Test ([facility])  High risk medication use - Plan: Hepatitis B core antibody, total, Hepatitis B surface antigen,  QuantiFERON-TB Gold Plus, IgG, IgA, IgM, Hepatitis B surface antibody,qualitative, Stratify JCV Antibody Test ([facility])  Right optic neuropathy   In summary, Mr. Langell is a 24 year old man with recent probable optic neuritis as well as milder balance issues and an episode of vertigo.  MRI of the brain and cervical spine is very consistent with multiple sclerosis.  I had a long discussion with him and his mother.  I am concerned that he is presenting with a higher level of aggressiveness than average.  Specifically, he has multiple spinal cord and posterior fossa lesions including the one enhancing focus at C6.  Furthermore there were 3 or 4 other enhancing lesions in the brain.  Because of the level of aggressiveness, it is essential that he initiate treatment on a highly efficacious disease modifying therapy.  In detail, we discussed Tysabri, Ocrevus and Kesimpta.  We will check blood work to help [place] decide between these options and he will give this more thought as well.  I will let him know the results of the studies hopefully next week.  I will see him back for regular scheduled visit in about 3 months or sooner if he has new or worsening neurologic symptoms.  We will initiate therapy locally but at some point he may need to transfer care to [facility] if travel becomes difficult.  Thank you for asking me to see Deryk.  Please let me know if I can be of further assistance with him or other patients in the future.   Jamall Strohmeier A. Epimenio Foot, MD, [facility] 06/02/2020, 9:06 AM Certified in Neurology, Clinical Neurophysiology, Sleep Medicine and Neuroimaging  [facility] [address] [place], [place] [address] [phone]

## 2020-06-02 NOTE — Telephone Encounter (Signed)
Placed JCV lab in [facility] lock box for routine lab pick up. Results pending. 

## 2020-06-03 ENCOUNTER — Encounter: Payer: Self-pay | Admitting: Family Medicine

## 2020-06-03 ENCOUNTER — Ambulatory Visit: Payer: BC Managed Care – PPO | Admitting: Family Medicine

## 2020-06-03 ENCOUNTER — Other Ambulatory Visit: Payer: Self-pay

## 2020-06-03 VITALS — BP 118/78 | HR 86 | Temp 97.3°F | Resp 18 | Ht 71.0 in | Wt 144.6 lb

## 2020-06-03 DIAGNOSIS — G35 Multiple sclerosis: Secondary | ICD-10-CM

## 2020-06-03 LAB — METHYLMALONIC ACID, SERUM: Methylmalonic Acid, Quantitative: 84 nmol/L (ref 0–378)

## 2020-06-03 MED ORDER — VITAMIN D3 25 MCG PO TABS
1000.0000 [IU] | ORAL_TABLET | Freq: Every day | ORAL | 3 refills | Status: DC
Start: 2020-06-03 — End: 2023-11-30

## 2020-06-03 MED ORDER — VITAMIN B-12 1000 MCG PO TABS
1000.0000 ug | ORAL_TABLET | Freq: Every day | ORAL | 3 refills | Status: AC
Start: 2020-06-03 — End: ?

## 2020-06-03 NOTE — Patient Instructions (Addendum)
Health Maintenance Due  Topic Date Due  . INFLUENZA VACCINE lets prioritize covid booster.  04/11/2020   Rooting for you and praying for you!

## 2020-06-03 NOTE — Progress Notes (Signed)
Phone [phone]   Subjective:  Albert Edwards is a 24 y.o. year old very pleasant male patient who presents  hospital follow up for Multiple Sclerosis. Patient was hospitalized from 05/29/20 to 05/31/20.    See problem oriented charting as well  Past Medical History-  Patient Active Problem List   Diagnosis Date Noted  . IBS (irritable colon syndrome)     Priority: Medium  . Right leg pain 07/02/2014    Priority: Low  . Multiple sclerosis exacerbation (HCC) 05/29/2020    Medications- reviewed and updated  A medical reconciliation was performed comparing current medicines to hospital discharge medications. Current Outpatient Medications  Medication Sig Dispense Refill  . acetaminophen (TYLENOL) 500 MG tablet Take 500 mg by mouth every 6 (six) hours as needed for moderate pain.    . fexofenadine (ALLEGRA) 180 MG tablet Take 180 mg by mouth daily.    . hyoscyamine (LEVSIN) 0.125 MG tablet Take 1 tablet (0.125 mg total) by mouth every 4 (four) hours as needed (for abdominal cramping. 4 doses a day maximum.). 90 tablet 5  . vitamin B-12 (CYANOCOBALAMIN) 1000 MCG tablet Take 1 tablet (1,000 mcg total) by mouth daily. 90 tablet 3  . Vitamin D3 (VITAMIN D) 25 MCG tablet Take 1 tablet (1,000 Units total) by mouth daily. 90 tablet 3   No current facility-administered medications for this visit.   Objective  Objective:  BP 118/78   Pulse 86   Temp (!) 97.3 F (36.3 C) (Temporal)   Resp 18   Ht 5\' 11"  (1.803 m)   Wt 144 lb 9.6 oz (65.6 kg)   SpO2 98%   BMI 20.17 kg/m  Gen: NAD, resting comfortably Stress ulcers on tongue CV: RRR no murmurs rubs or gallops Lungs: CTAB no crackles, wheeze, rhonchi Abdomen: soft/nontender/nondistended/normal bowel sounds.  Ext: no edema Skin: warm, dry    Assessment and Plan:  # MS new diagnosis/exacerbation S: Patient originally seen May 08, 2020 virtually for vertigo and appear to be BPPV.  BPPV symptoms improved but patient  continued to have blurry vision with running-he contacted me on September 12 about this and had persistent symptoms of blurry vision in the right eye with running despite no vertigo.  We got an MRI of the brain which showed "Numerous foci of abnormal T2 and FLAIR signal within the cerebral hemispheric white matter, with a distribution and morphology highly suggestive of multiple sclerosis. Single left posterior frontal white matter lesion shows contrast enhancement, indicating active Disease."  Patient was immediately sent to the hospital.  He was managed by Dr. September 14.  Patient was given 3 doses of IV Solu-Medrol.  No signs or symptoms at that time but patient had not run again.  No driving restrictions as symptoms had only been with running  Patient with strong family history of B12 deficiency-was started on B12 supplementation due to low normal levels Lab Results  Component Value Date   VITAMINB12 324 05/29/2020    Low normal vitamin D levels-neurology also recommended supplementation 1000 units/day Lab Results  Component Value Date   VD25OH 38.47 05/29/2020   Perhaps mild blurry vision in shower just right eye but not as severe as prior with running- he states was very obvious previously and he is not really even sure was blurry.   He had follow up visit with neurology yesterday Dr. 05/31/2020- plan is to consider between several medicines pending bloodwork- Tysabri and Ocrevus.   He would like to discuss if he should the  covid booster- had johnson and johnson back in april . And lifestyle changes and how it would effect his immune system. He also stated that he has been having abdominal pain but he thinks it may be caused by stress. He stated he does have a history of IBS- didn't do well with hyoscyamine as constipated him.   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 He is around 20 at most mainly at desk without door A/P: New onset multiple sclerosis presenting  with blurry vision with running in the right eye.  Disease in multiple areas noted on MRI brain, cervical spine, thoracic spine.  Has already completed 3 days of IV Solu-Medrol.  He has had blood work completed with Dr. Precious Haws results to determine therapy between Tysabri and Ocrevus. Continue close neurology follow up  We discussed life choice and school at [employer] vaccine are not required and would be around a lot of other students in tighter spaces so will ask for virtual accomodation (note for school written as well as for time off this week from school due to hospitalization) . Work is spaced out and he probably has contact with 20 other individuals- this also would be high value for him to continue so we discussed distancing and using n95 ideally.   As far as covid 19 vaccine is concerned - discussed with Dr. Epimenio Foot today and he would like for him to get booster prior to starting immunosuppressant. He is fine with any of the 3 options. Abdulwahab has already had J+J and we discussed recent trial showing much improved efficacy with booster vs. Crossing over to MRNA vaccine (though we have less data). He is leaning towards J+J and we discussed ideally getting this today.   Prioritize booster over flu shot- but would likely recommend flu shot within a month of either J+J booster or 2 shot series with MRNA  Discussed no specific MS diet but would recommend whole food plant based diet.   Refill vitamin b12 and vitamin D (low normal for both)  Recommended follow up: we did not schedule planned follow up but im available to [facility] as needed by [employer] as much as needed Future Appointments  Date Time Provider Department Center  09/15/2020 10:00 AM Sater, Pearletha Furl, MD [facility] None    Lab/Order associations:   ICD-10-CM   1. Multiple sclerosis (HCC)  G35     Meds ordered this encounter  Medications  . vitamin B-12 (CYANOCOBALAMIN) 1000 MCG tablet    Sig: Take 1 tablet (1,000 mcg total) by mouth  daily.    Dispense:  90 tablet    Refill:  3  . Vitamin D3 (VITAMIN D) 25 MCG tablet    Sig: Take 1 tablet (1,000 Units total) by mouth daily.    Dispense:  90 tablet    Refill:  3   Time Spent: 47 minutes of total time (9:13 AM- 10:00 AM was spent on the date of the encounter performing the following actions: chart review prior to seeing the patient, obtaining history, performing a medically necessary exam, counseling on the treatment plan, placing orders, and documenting in our EHR.    Return precautions advised.  [name], MD

## 2020-06-04 LAB — QUANTIFERON-TB GOLD PLUS
QuantiFERON Mitogen Value: >10 IU/mL
QuantiFERON Nil Value: 0.01 IU/mL
QuantiFERON TB1 Ag Value: 0.04 IU/mL
QuantiFERON TB2 Ag Value: 0.03 IU/mL
QuantiFERON-TB Gold Plus: NEGATIVE

## 2020-06-04 LAB — IGG, IGA, IGM
IgA/Immunoglobulin A, Serum: 130 mg/dL (ref 90–386)
IgG (Immunoglobin G), Serum: 799 mg/dL (ref 603–1613)
IgM (Immunoglobulin M), Srm: 74 mg/dL (ref 20–172)

## 2020-06-04 LAB — HEPATITIS B SURFACE ANTIGEN: Hepatitis B Surface Ag: NEGATIVE

## 2020-06-04 LAB — HEPATITIS B SURFACE ANTIBODY,QUALITATIVE: Hep B Surface Ab, Qual: NONREACTIVE

## 2020-06-04 LAB — HEPATITIS B CORE ANTIBODY, TOTAL: Hep B Core Total Ab: NEGATIVE

## 2020-06-07 NOTE — Telephone Encounter (Signed)
JCV ab drawn on 06/02/20 positive, index: 3.48. Gave to MD for review.

## 2020-06-10 NOTE — Telephone Encounter (Signed)
Faxed complete/signed Ocrevus start form to [employer] at [phone]. Received fax confirmation. Gave completed start form to [facility] for them to process. Included office visit notes,signed order for [facility], labs, MRI. Sent copy of start form to be scanned to epic.

## 2020-06-23 NOTE — Telephone Encounter (Signed)
Emailed Liane/Taylor to confirm this. Waiting on response.

## 2020-09-15 ENCOUNTER — Encounter: Payer: Self-pay | Admitting: Neurology

## 2020-09-15 ENCOUNTER — Other Ambulatory Visit: Payer: Self-pay

## 2020-09-15 ENCOUNTER — Ambulatory Visit (INDEPENDENT_AMBULATORY_CARE_PROVIDER_SITE_OTHER): Payer: BC Managed Care – PPO | Admitting: Neurology

## 2020-09-15 VITALS — BP 127/71 | HR 56 | Ht 71.0 in | Wt 146.5 lb

## 2020-09-15 DIAGNOSIS — H469 Unspecified optic neuritis: Secondary | ICD-10-CM

## 2020-09-15 DIAGNOSIS — Z79899 Other long term (current) drug therapy: Secondary | ICD-10-CM | POA: Diagnosis not present

## 2020-09-15 DIAGNOSIS — G35 Multiple sclerosis: Secondary | ICD-10-CM

## 2020-09-15 NOTE — Progress Notes (Signed)
[facility]  PATIENT: Albert Edwards DOB: 29-Jan-1996  REFERRING DOCTOR OR PCP:  [name] / [name] SOURCE: Patient, notes from hospitalization and PCP, imaging laboratory reports, MRI images personally reviewed and interpreted.  _________________________________   HISTORICAL  CHIEF COMPLAINT:  Chief Complaint  Patient presents with  . Follow-up    RM 12, alone. Last seen 06/02/2020. On Ocreuvs for MS. Last infusion: 07/19/20, next: 01/31/21. Receives at [facility] with intrafusion.    HISTORY OF PRESENT ILLNESS:  Albert Edwards is a 25 y.o. man with relapsing remitting multiple sclerosis.  Update 09/15/2020: He is genrally feeling well.   Vision and balance/vertigo got back to baseline.   Even when he exercises he is not getting any new symptom.    He is on Ocrevus and tolerates it well.   He had a mild itchy throat the first 2 halves of the infusions and did well with Benadryl.    Nextr infusion will be in May  Currently, he has no definite neurologic symptoms.   He has stumbled some but no falls.  No trouble with stairs/laddes.  He remians very active.    No sensory or strength issues.    Vision is baseline and not worsening much with exercise as happened last year.  Bladder function is ok - some frequency but no change compared to last year.       He denies much fatigue.   He sleeps well.  He is an Engineer, building services at [employer].  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       MS Histroy:  He had the onset of vertigo in August 2021 and improved some with meclizine.   Shortly thereafter, he noted blurry vision while running in early September.  The visual symptoms persisted he had an MRI of the brain performed 05/28/2020 which showed multiple foci c/w MS, some that enhanced.    He had noted minimal balance issues off/on over the last year but was going up and down stairs without issues and carried on all his normal  activities.   He had no numbness, tingling or weakness.   He had no bladder vision.    After the abnormal brain MRI, he was admitted to [facility] 05/29/19 and received 3 days of IV Solu-Medrol.  Additionally, he had MRI of the cervical thoracic spine showing additional lesions including 1 enhancing lesion to the right adjacent to C6.  He tolerated the steroid well and felt some improvement of symptoms.  He has not had any new symptoms since that time.  I saw him shortly after discharge and treatment options were discussed.  He opted to begin Ocrevus.  His first dose was October 2021  No FH of MS,   His father had B12 deficiency and needed treatment.    He is otherwise in good health.      MRI images/Labs MRI brain 05/28/2020 shows multiple T2/FLAIR hyperintense foci.  Foci are present in the left middle cerebellar peduncle, cerebellar vermis, and in the periventricular, juxtacortical deep white matter of the hemispheres.  Several foci enhance after contrast.  The largest is in the posterior left frontal lobe.  There are also a few punctate enhancing foci in the hemispheres including a periventricular enhancing focus on the right and a juxtacortical focus in the left parietal lobe.  MRI of the cervical spine 05/30/2020 showed an enhancing focus adjacent to C6 another T2 hyperintense foci that are chronic.  These are  located anteriorly adjacent to C3-C4, posterior laterally to the right adjacent to C4, posterior laterally to the left adjacent to C5, anteriorly adjacent to C5, laterally to the right adjacent to C6 (enhancing consistent with acute focus), posterior laterally to the left adjacent to C7, centrally adjacent to T1,  MRI of the thoracic spine 05/30/2020 shows several T2 hyperintense foci consistent with chronic demyelinating plaque.  No enhancing lesions.   These are located centrally adjacent to T1, posterolaterally to the left adjacent to T9,  Laboratory tests 05/28/2020 through 05/30/2020 show negative  PCR test for SARS-CoV-2.  HIV negative.  SSA/SSB negative, Vitamin D was 38.   REVIEW OF SYSTEMS: Constitutional: No fevers, chills, sweats, or change in appetite Eyes: No visual changes, double vision, eye pain Ear, nose and throat: No hearing loss, ear pain, nasal congestion, sore throat Cardiovascular: No chest pain, palpitations Respiratory: No shortness of breath at rest or with exertion.   No wheezes GastrointestinaI: No nausea, vomiting, diarrhea, abdominal pain, fecal incontinence Genitourinary: No dysuria, urinary retention or frequency.  No nocturia. Musculoskeletal: No neck pain, back pain Integumentary: No rash, pruritus, skin lesions Neurological: as above Psychiatric: No depression at this time.  No anxiety Endocrine: No palpitations, diaphoresis, change in appetite, change in weigh or increased thirst Hematologic/Lymphatic: No anemia, purpura, petechiae. Allergic/Immunologic: No itchy/runny eyes, nasal congestion, recent allergic reactions, rashes  ALLERGIES: No Known Allergies  HOME MEDICATIONS:  Current Outpatient Medications:  .  acetaminophen (TYLENOL) 500 MG tablet, Take 500 mg by mouth every 6 (six) hours as needed for moderate pain., Disp: , Rfl:  .  fexofenadine (ALLEGRA) 180 MG tablet, Take 180 mg by mouth as needed., Disp: , Rfl:  .  hyoscyamine (LEVSIN) 0.125 MG tablet, Take 1 tablet (0.125 mg total) by mouth every 4 (four) hours as needed (for abdominal cramping. 4 doses a day maximum.)., Disp: 90 tablet, Rfl: 5 .  ocrelizumab (OCREVUS) 300 MG/10ML injection, Inject into the vein every 6 (six) months. Initial dose: 300mg  IV on day 1; repeat on day 15 Maintenance dose: 600mg  IV q 6 months, Disp: , Rfl:  .  vitamin B-12 (CYANOCOBALAMIN) 1000 MCG tablet, Take 1 tablet (1,000 mcg total) by mouth daily., Disp: 90 tablet, Rfl: 3 .  Vitamin D3 (VITAMIN D) 25 MCG tablet, Take 1 tablet (1,000 Units total) by mouth daily., Disp: 90 tablet, Rfl: 3  PAST MEDICAL  HISTORY: Past Medical History:  Diagnosis Date  . IBS (irritable colon syndrome)    Constipation predominant. Constipation- with BM abdominal pain improves. Comes in waves with higher stress. worse with less exercise. Plan: increase exercise, watch FODMAP, consider metamucil if that doesnt work  . Multiple sclerosis (HCC)     PAST SURGICAL HISTORY: Past Surgical History:  Procedure Laterality Date  . ADENOIDECTOMY     2006  . TONSILLECTOMY      FAMILY HISTORY: Family History  Problem Relation Age of Onset  . Healthy Mother        and father  . Hyperlipidemia Other        grandparent  . Hypertension Other        grandparent  . Diabetes Other        grandparent  . Hypothyroidism Other   . ADD / ADHD Brother   . Other Father        Vit B12 def    SOCIAL HISTORY:  Social History   Socioeconomic History  . Marital status: Single    Spouse name: Not on file  .  Number of children: Not on file  . Years of education: Not on file  . Highest education level: Not on file  Occupational History  . Not on file  Tobacco Use  . Smoking status: Never Smoker  . Smokeless tobacco: Never Used  Substance and Sexual Activity  . Alcohol use: Yes    Alcohol/week: 0.0 standard drinks    Comment: 1-2 per week  . Drug use: No  . Sexual activity: Not on file  Other Topics Concern  . Not on file  Social History Narrative   Family: Single. Not dating right now.       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    [place]- for Programmer, multimedia 2020   [employer]- [employer]. 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   General: The patient is well-developed and well-nourished and in no acute distress  HEENT:  Head is /AT.  Sclera are anicteric.    Skin: Extremities are without rash or  edema.  Neurologic Exam  Mental status: The patient is alert and oriented x 3 at the time of the examination. The patient has apparent normal recent and remote memory, with an apparently normal attention span and concentration ability.   Speech is normal.  Cranial nerves: Extraocular movements are full.  Color vision was symmetric.  Facial strength is normal.  Trapezius and sternocleidomastoid strength is normal. No dysarthria is noted.  No obvious hearing deficits noted.  Motor:  Muscle bulk is normal.   Tone is normal. Strength is  5 / 5 in all 4 extremities.   Sensory: Sensory testing is intact to pinprick, soft touch and vibration sensation in all 4 extremities.  Coordination: Cerebellar testing reveals good finger-nose-finger and heel-to-shin bilaterally.  Gait and station: Station is normal.   Gait is normal. Tandem gait is now normal. Romberg is negative.   Reflexes: Deep tendon reflexes are symmetric but increased, 3 in the arms and 3+ at the knees with spread and 1 beat of nonsustained clonus at the ankles.        DIAGNOSTIC DATA (LABS, IMAGING, TESTING) - I reviewed patient records, labs, notes, testing and imaging myself where available.  Lab Results  Component Value Date   WBC 8.4 05/30/2020   HGB 15.4 05/30/2020   HCT 45.2 05/30/2020   MCV 88.1 05/30/2020   PLT 182 05/30/2020      Component Value Date/Time   NA 138 05/30/2020  0546   K 4.2 05/30/2020 0546   CL 106 05/30/2020 0546   CO2 20 (L) 05/30/2020 0546   GLUCOSE 119 (H) 05/30/2020 06/01/2020  BUN 19 05/30/2020 0546   CREATININE 0.78 05/30/2020 0546   CALCIUM 9.6 05/30/2020 0546   PROT 6.9 06/06/2019 0835   ALBUMIN 5.0 06/06/2019 0835   AST 16 06/06/2019 0835   ALT 12 06/06/2019 0835   ALKPHOS 59 06/06/2019 0835   BILITOT 2.3 (H) 06/06/2019 0835   GFRNONAA >60 05/30/2020 0546   GFRAA >60 05/30/2020 0546   Lab Results  Component Value Date   CHOL 114 06/06/2019   HDL 44.20 06/06/2019   LDLCALC 59 06/06/2019   TRIG 50.0 06/06/2019   CHOLHDL 3 06/06/2019   No results found for: HGBA1C Lab Results  Component Value Date   VITAMINB12 324 05/29/2020   No results found for: TSH     ASSESSMENT AND PLAN  Multiple sclerosis, relapsing-remitting (HCC)  High risk medication use  Right optic neuropathy  1.   He will continue Ocrevus.  We discussed that Ocrevus can reduce the effectiveness of vaccinations.  He did his COVID-19 vaccination prior to starting his first dose of Ocrevus.  He may need additional Benadryl as he did with the first 2 halves of the infusions 2.   Stay active and exercise as tolerated. 3.   He will return to see me in 6 months or sooner for new or worsening neurologic symptoms.  Tino Ronan A. Felecia Shelling, MD, [facility] 12/12/3534, 14:43 AM Certified in Neurology, Clinical Neurophysiology, Sleep Medicine and Neuroimaging  [facility] [address] [place], [place] [address] [phone]

## 2020-10-28 ENCOUNTER — Encounter: Payer: Self-pay | Admitting: *Deleted

## 2021-02-01 ENCOUNTER — Telehealth: Payer: Self-pay | Admitting: Neurology

## 2021-02-01 DIAGNOSIS — G35 Multiple sclerosis: Secondary | ICD-10-CM

## 2021-02-01 DIAGNOSIS — Z79899 Other long term (current) drug therapy: Secondary | ICD-10-CM

## 2021-02-01 NOTE — Telephone Encounter (Signed)
Per Dr. Epimenio Foot- "He had his last MRIs in September.  I do like to check them about 6 - 9 months after starting a new medication so now is a good time to order an MR of the brain and cervical spine with and without contrast"

## 2021-02-01 NOTE — Telephone Encounter (Signed)
Pt called wanting to speak to the RN or Provider about scheduling an MRI. Please advise.

## 2021-02-01 NOTE — Telephone Encounter (Signed)
Took call from phone staff and spoke w/ pt. He is wanting to know when Dr. Epimenio Foot wanted him to get repeat MRI. Would like to go somewhere in /[place] area that is in-network w/ his insurance. Advised he can call his insurance to get this information. He can then let us know so we can place order for that location.

## 2021-02-01 NOTE — Telephone Encounter (Signed)
Called pt back. Relayed Dr. Bonnita Hollow message. He is agreeable to plan. He has not found imaging location yet, he will call back w/ this info. Advised I will go ahead and place orders and place note he would like to go to [place] area and will call back w/ location.

## 2021-02-01 NOTE — Telephone Encounter (Signed)
LVM returning pt call. 

## 2021-02-01 NOTE — Telephone Encounter (Signed)
Pt called back to give address to location.  [facility] [address], [place], [place] [address]

## 2021-02-03 NOTE — Telephone Encounter (Addendum)
Received approval from [facility] for MRI brain & MRI cervical spine. PA #[id number] (02/03/21- 08/01/21). Site location is set to patient's requested location. Order & Berkley Harvey have been faxed to [phone].

## 2021-02-26 ENCOUNTER — Encounter: Payer: Self-pay | Admitting: Neurology

## 2021-03-02 ENCOUNTER — Encounter: Payer: Self-pay | Admitting: *Deleted

## 2021-03-16 ENCOUNTER — Encounter: Payer: Self-pay | Admitting: Neurology

## 2021-03-16 ENCOUNTER — Other Ambulatory Visit: Payer: Self-pay

## 2021-03-16 ENCOUNTER — Ambulatory Visit (INDEPENDENT_AMBULATORY_CARE_PROVIDER_SITE_OTHER): Payer: BC Managed Care – PPO | Admitting: Neurology

## 2021-03-16 VITALS — BP 141/77 | HR 88 | Ht 71.0 in | Wt 145.0 lb

## 2021-03-16 DIAGNOSIS — R350 Frequency of micturition: Secondary | ICD-10-CM | POA: Insufficient documentation

## 2021-03-16 DIAGNOSIS — Z79899 Other long term (current) drug therapy: Secondary | ICD-10-CM | POA: Diagnosis not present

## 2021-03-16 DIAGNOSIS — H469 Unspecified optic neuritis: Secondary | ICD-10-CM

## 2021-03-16 DIAGNOSIS — G35 Multiple sclerosis: Secondary | ICD-10-CM

## 2021-03-16 NOTE — Progress Notes (Signed)
[facility]  PATIENT: Albert Edwards DOB: August 29, 1996  REFERRING DOCTOR OR PCP:  [name] / [name] SOURCE: Patient, notes from hospitalization and PCP, imaging laboratory reports, MRI images personally reviewed and interpreted.  _________________________________   HISTORICAL  CHIEF COMPLAINT:  Chief Complaint  Patient presents with   Follow-up    RM 1, alone. Last seen 09/15/2020. On Ocrevus for MS. Last infusion: 01/31/21, next: 08/15/21. Receives at [facility] w/ intrafusion. Gets scratchy throat during Ocrevus infusion but benadryl helps. Does not have any SE post infusion.    HISTORY OF PRESENT ILLNESS:  Albert Edwards is a 25 y.o. man with relapsing remitting multiple sclerosis.  Update 03/16/2021: He started Ocrevus November 2021 and had his last infusion 01/31/22.     He did have a mild reaction with throa scratchy that responded to Benadryl.  I personally reviewed his recent brain and cervical spine MRI performed couple weeks ago.  There were no new lesions.  2 enhancing lesions in 2021 no longer do and have an improved appearance  Gait and balance are doing well.   He has no missteps/stumbles.   He Luisa Dago holds the [facility] feels ok not using it.   He walks a few mile daily.      Strength and sensation are doing well.   Blurry vision has resolved.   Last year it was blurry when ho or with exertion.   He has urinary frequency.       Not bad enough to treat.     Fatigue is not an issue.   He sleeps well.   He exercises.  He does everything he needs t and wants to do.   Mood is fine.   He noted a few episodes of word finding difficulty but not enough to cause issues.     He had one episode of back pain after bending down.  He was better couple days later.  He did an Engineer, building services at [employer].  He is working at [employer] in [place].      MS Histroy:  He had the onset of vertigo in August 2021 and improved some  with meclizine.   Shortly thereafter, he noted blurry vision while running in early September.  The visual symptoms persisted he had an MRI of the brain performed 05/28/2020 which showed multiple foci c/w MS, some that enhanced.    He had noted minimal balance issues off/on over the last year but was going up and down stairs without issues and carried on all his normal activities.   He had no numbness, tingling or weakness.   He had no bladder vision.    After the abnormal brain MRI, he was admitted to [facility] 05/29/19 and received 3 days of IV Solu-Medrol.  Additionally, he had MRI of the cervical thoracic spine showing additional lesions including 1 enhancing lesion to the right adjacent to C6.  He tolerated the steroid well and felt some improvement of symptoms.  He has not had any new symptoms since that time.  I saw him shortly after discharge and treatment options were discussed.  He opted to begin Ocrevus.  His first dose was October 2021  No FH of MS,   His father had B12 deficiency and needed treatment.    He is otherwise in good health.      MRI images/Labs MRI brain 05/28/2020 shows multiple T2/FLAIR hyperintense foci.  Foci are present in the left middle cerebellar peduncle, cerebellar vermis, and  in the periventricular, juxtacortical deep white matter of the hemispheres.  Several foci enhance after contrast.  The largest is in the posterior left frontal lobe.  There are also a few punctate enhancing foci in the hemispheres including a periventricular enhancing focus on the right and a juxtacortical focus in the left parietal lobe.  MRI of the cervical spine 05/30/2020 showed an enhancing focus adjacent to C6 another T2 hyperintense foci that are chronic.  These are located anteriorly adjacent to C3-C4, posterior laterally to the right adjacent to C4, posterior laterally to the left adjacent to C5, anteriorly adjacent to C5, laterally to the right adjacent to C6 (enhancing consistent with acute focus),  posterior laterally to the left adjacent to C7, centrally adjacent to T1,  MRI of the thoracic spine 05/30/2020 shows several T2 hyperintense foci consistent with chronic demyelinating plaque.  No enhancing lesions.   These are located centrally adjacent to T1, posterolaterally to the left adjacent to T9,  MRI of the brain 02/26/2021 shows T2/flair hyperintense lesions in the left middle cerebellar peduncle and in the periventricular, juxtacortical and deep white matter of both hemispheres.  None of the foci enhance.  Compared to the MRI from 05/28/2020, there do not appear to be any new lesions.  The foci that enhanced in 2021 no longer do so  MRI of the cervical spine 02/26/2021 shows T2 hyperintense foci anteriorly at C3-C4, posterolaterally to the right at C4, posterolaterally to the left at C4 C5-C5, to the right at C5, to the right and the left at C5-C6, posterolaterally to the left at C6-C7 and possibly a focus to the right at that level, anterocentrally at T1.  None of the foci enhance.  Compared to the MRI from 05/30/2020 and compared side-by-side, there were no new lesions.  The focus that enhanced in 2021 no longer does  Laboratory tests 05/28/2020 through 05/30/2020 show negative PCR test for SARS-CoV-2.  HIV negative.  SSA/SSB negative, Vitamin D was 38.   REVIEW OF SYSTEMS: Constitutional: No fevers, chills, sweats, or change in appetite Eyes: No visual changes, double vision, eye pain Ear, nose and throat: No hearing loss, ear pain, nasal congestion, sore throat Cardiovascular: No chest pain, palpitations Respiratory:  No shortness of breath at rest or with exertion.   No wheezes GastrointestinaI: No nausea, vomiting, diarrhea, abdominal pain, fecal incontinence Genitourinary:  No dysuria, urinary retention or frequency.  No nocturia. Musculoskeletal:  No neck pain, back pain Integumentary: No rash, pruritus, skin lesions Neurological: as above Psychiatric: No depression at this time.   No anxiety Endocrine: No palpitations, diaphoresis, change in appetite, change in weigh or increased thirst Hematologic/Lymphatic:  No anemia, purpura, petechiae. Allergic/Immunologic: No itchy/runny eyes, nasal congestion, recent allergic reactions, rashes  ALLERGIES: No Known Allergies  HOME MEDICATIONS:  Current Outpatient Medications:    acetaminophen (TYLENOL) 500 MG tablet, Take 500 mg by mouth every 6 (six) hours as needed for moderate pain., Disp: , Rfl:    fexofenadine (ALLEGRA) 180 MG tablet, Take 180 mg by mouth as needed., Disp: , Rfl:    hyoscyamine (LEVSIN) 0.125 MG tablet, Take 1 tablet (0.125 mg total) by mouth every 4 (four) hours as needed (for abdominal cramping. 4 doses a day maximum.)., Disp: 90 tablet, Rfl: 5   ocrelizumab (OCREVUS) 300 MG/10ML injection, Inject into the vein every 6 (six) months. Initial dose: 300mg  IV on day 1; repeat on day 15 Maintenance dose: 600mg  IV q 6 months, Disp: , Rfl:    vitamin B-12 (  CYANOCOBALAMIN) 1000 MCG tablet, Take 1 tablet (1,000 mcg total) by mouth daily., Disp: 90 tablet, Rfl: 3   Vitamin D3 (VITAMIN D) 25 MCG tablet, Take 1 tablet (1,000 Units total) by mouth daily., Disp: 90 tablet, Rfl: 3  PAST MEDICAL HISTORY: Past Medical History:  Diagnosis Date   IBS (irritable colon syndrome)    Constipation predominant. Constipation- with BM abdominal pain improves. Comes in waves with higher stress. worse with less exercise. Plan: increase exercise, watch FODMAP, consider metamucil if that doesnt work   Multiple sclerosis (HCC)     PAST SURGICAL HISTORY: Past Surgical History:  Procedure Laterality Date   ADENOIDECTOMY     2006   TONSILLECTOMY      FAMILY HISTORY: Family History  Problem Relation Age of Onset   Healthy Mother        and father   Hyperlipidemia Other        grandparent   Hypertension Other        grandparent   Diabetes Other        grandparent   Hypothyroidism Other    ADD / ADHD Brother    Other  Father        Vit B12 def    SOCIAL HISTORY:  Social History   Socioeconomic History   Marital status: Single    Spouse name: Not on file   Number of children: Not on file   Years of education: Not on file   Highest education level: Not on file  Occupational History   Not on file  Tobacco Use   Smoking status: Never   Smokeless tobacco: Never  Substance and Sexual Activity   Alcohol use: Yes    Alcohol/week: 0.0 standard drinks    Comment: 1-2 per week   Drug use: No   Sexual activity: Not on file  Other Topics Concern   Not on file  Social History Narrative   Family: Single. Not dating right now.       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    [employer]- for Programmer, multimedia 2020   [employer]- [employer]. 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   General: The patient is well-developed and well-nourished and in no acute distress  HEENT:  Head is [place]/AT.  Sclera are anicteric.    Skin: Extremities are without rash or  edema.  Neurologic Exam  Mental status: The patient is alert and oriented x 3 at the time of the examination. The patient has apparent normal recent and remote memory, with an apparently normal attention span and concentration  ability.   Speech is normal.  Cranial nerves: Extraocular movements are full.  Color vision was symmetric.  Facial strength is normal.  Trapezius and sternocleidomastoid strength is normal. No dysarthria is noted.  No obvious hearing deficits noted.  Motor:  Muscle bulk is normal.   Tone is normal. Strength is  5 / 5 in all 4 extremities.   Sensory: Sensory testing is intact to pinprick, soft touch and vibration sensation in all 4 extremities.  Coordination: Cerebellar testing reveals good finger-nose-finger and heel-to-shin bilaterally.  Gait and station: Station is normal.   Gait is normal. Tandem gait is now normal. Romberg is negative.   Reflexes: Deep tendon reflexes are symmetric but increased, 3 in the arms and 3+ at the knees with spread and 1 beat of nonsustained clonus at the ankles.        DIAGNOSTIC DATA (LABS, IMAGING, TESTING) - I reviewed patient records, labs, notes, testing and imaging myself where available.  Lab Results  Component Value Date   WBC 8.4 05/30/2020   HGB 15.4 05/30/2020   HCT 45.2 05/30/2020   MCV 88.1 05/30/2020   PLT 182 05/30/2020      Component Value Date/Time   NA 138 05/30/2020 0546   K 4.2 05/30/2020 0546   CL 106 05/30/2020 0546   CO2 20 (L) 05/30/2020 0546   GLUCOSE 119 (H) 05/30/2020 0546   BUN 19 05/30/2020 0546   CREATININE 0.78 05/30/2020 0546   CALCIUM 9.6 05/30/2020 0546   PROT 6.9 06/06/2019 0835   ALBUMIN 5.0 06/06/2019 0835   AST 16 06/06/2019 0835   ALT 12 06/06/2019 0835   ALKPHOS 59 06/06/2019 0835   BILITOT 2.3 (H) 06/06/2019 0835   GFRNONAA >60 05/30/2020 0546   GFRAA >60 05/30/2020 0546   Lab Results  Component Value Date   CHOL 114 06/06/2019   HDL 44.20 06/06/2019   LDLCALC 59 06/06/2019   TRIG 50.0 06/06/2019   CHOLHDL 3 06/06/2019   No results found for: HGBA1C Lab Results  Component Value Date   VITAMINB12 324 05/29/2020   No results found for: TSH     ASSESSMENT AND PLAN  Multiple  sclerosis, relapsing-remitting (HCC) - Plan: IgG, IgA, IgM, CBC with Differential/Platelet  High risk medication use - Plan: IgG, IgA, IgM, CBC with Differential/Platelet  Right optic neuropathy  Urinary frequency  1.   He has an aggressive MS presenting with multiple lesions of the spinal cord including 1 acute focus that was probably causing symptoms of presentation.  Additionally, he has some infratentorial lesions.  Because of the aggressive nature of his MS, it is essential that he be on a highly effective disease modifying therapy (per AAN guidelines).  He has done very well on Ocrevus with no new lesions on his last MRI.  He will continue Ocrevus.  Continue to premedicate with Benadryl and to give additional Benadryl if needed.  We will check blood work today.   2.   Stay active and exercise as tolerated. 3.   If bladder issues worsen consider an anticholinergic agent. 4.   He will return to see me in 6 months or sooner for new or worsening neurologic symptoms.  45-minute office visit with the majority of the time spent face-to-face for history and physical, discussion/counseling and decision-making.  Additional time with imaging review (including side-by-side comparison of brain and spine MRIs), record review and documentation.   Miana Politte A. Epimenio FootSater, MD, [facility],FAAN 03/16/2021, 11:24 AM Certified in Neurology, Clinical Neurophysiology, Sleep Medicine and Neuroimaging  [facility] [phone]  [address], [place], [place] [address] [phone]

## 2021-03-17 LAB — IGG, IGA, IGM
IgA/Immunoglobulin A, Serum: 140 mg/dL (ref 90–386)
IgG (Immunoglobin G), Serum: 863 mg/dL (ref 603–1613)
IgM (Immunoglobulin M), Srm: 48 mg/dL (ref 20–172)

## 2021-03-17 LAB — CBC WITH DIFFERENTIAL/PLATELET
Basophils Absolute: 0 10*3/uL (ref 0.0–0.2)
Basos: 1 %
EOS (ABSOLUTE): 0.1 10*3/uL (ref 0.0–0.4)
Eos: 3 %
Hematocrit: 50.9 % (ref 37.5–51.0)
Hemoglobin: 17.2 g/dL (ref 13.0–17.7)
Immature Grans (Abs): 0 10*3/uL (ref 0.0–0.1)
Immature Granulocytes: 0 %
Lymphocytes Absolute: 0.9 10*3/uL (ref 0.7–3.1)
Lymphs: 21 %
MCH: 29.4 pg (ref 26.6–33.0)
MCHC: 33.8 g/dL (ref 31.5–35.7)
MCV: 87 fL (ref 79–97)
Monocytes Absolute: 0.5 10*3/uL (ref 0.1–0.9)
Monocytes: 11 %
Neutrophils Absolute: 2.7 10*3/uL (ref 1.4–7.0)
Neutrophils: 64 %
Platelets: 190 10*3/uL (ref 150–450)
RBC: 5.86 x10E6/uL — ABNORMAL HIGH (ref 4.14–5.80)
RDW: 12.8 % (ref 11.6–15.4)
WBC: 4.1 10*3/uL (ref 3.4–10.8)

## 2021-09-21 ENCOUNTER — Encounter: Payer: Self-pay | Admitting: Family Medicine

## 2021-09-21 ENCOUNTER — Other Ambulatory Visit: Payer: Self-pay

## 2021-09-21 ENCOUNTER — Ambulatory Visit: Payer: BC Managed Care – PPO | Admitting: Neurology

## 2021-09-21 ENCOUNTER — Ambulatory Visit (INDEPENDENT_AMBULATORY_CARE_PROVIDER_SITE_OTHER): Payer: BC Managed Care – PPO | Admitting: Family Medicine

## 2021-09-21 ENCOUNTER — Encounter: Payer: Self-pay | Admitting: Neurology

## 2021-09-21 VITALS — BP 131/76 | HR 64 | Ht 71.0 in | Wt 155.0 lb

## 2021-09-21 VITALS — BP 130/62 | HR 75 | Temp 98.3°F | Ht 71.0 in | Wt 152.2 lb

## 2021-09-21 DIAGNOSIS — R350 Frequency of micturition: Secondary | ICD-10-CM | POA: Diagnosis not present

## 2021-09-21 DIAGNOSIS — Z79899 Other long term (current) drug therapy: Secondary | ICD-10-CM | POA: Diagnosis not present

## 2021-09-21 DIAGNOSIS — Z0000 Encounter for general adult medical examination without abnormal findings: Secondary | ICD-10-CM

## 2021-09-21 DIAGNOSIS — Z23 Encounter for immunization: Secondary | ICD-10-CM

## 2021-09-21 DIAGNOSIS — G35 Multiple sclerosis: Secondary | ICD-10-CM | POA: Diagnosis not present

## 2021-09-21 DIAGNOSIS — K581 Irritable bowel syndrome with constipation: Secondary | ICD-10-CM | POA: Diagnosis not present

## 2021-09-21 DIAGNOSIS — H469 Unspecified optic neuritis: Secondary | ICD-10-CM

## 2021-09-21 DIAGNOSIS — F419 Anxiety disorder, unspecified: Secondary | ICD-10-CM

## 2021-09-21 DIAGNOSIS — E559 Vitamin D deficiency, unspecified: Secondary | ICD-10-CM | POA: Diagnosis not present

## 2021-09-21 NOTE — Progress Notes (Signed)
[facility]  PATIENT: Bren Borromeo DOB: Sep 12, 1995  REFERRING DOCTOR OR PCP:  Garret Reddish / [place] Rack SOURCE: Patient, notes from hospitalization and PCP, imaging laboratory reports, MRI images personally reviewed and interpreted.  _________________________________   HISTORICAL  CHIEF COMPLAINT:  Chief Complaint  Patient presents with   Follow-up    Rm 1, alone. Here for 6 month MS f/u, on Ocrevus. Pt reports doing well. Sx are stable, no new or worsening in sx.     HISTORY OF PRESENT ILLNESS:  Levon Hecht is a 26 y.o. man with relapsing remitting multiple sclerosis.  Update 09/21/2021: He started Ocrevus November 2021 and had his last infusion 01/31/2021.     He has not had any exacerbaions or new neurologic symptoms.    He had moderate infusion reactions first dose and first   I personally reviewed his recent brain and cervical spine MRI performed couple weeks ago.  There were no new lesions.  2 enhancing lesions in 2021 no longer do and have an improved appearance  Gait and balance are doing well.   No stumbles or falls.   He Baldo Daub holds [employer] though he feels he does not need to.     He walks a few mile daily.   He goes to the gym.   Strength and sensation are doing well.   He very rarely gets a Lhermitte sign/.   Blurry vision has resolved, even in heat.   He has urinary frequency.       Not worse and not bad enough to treat.     Fatigue is not an issue.   He sleeps well.   He exercises.  He does everything he needs to and wants to do.   Mood is fine.   He rarely has word finding difficulty but not enough to cause issues.     He did an Marketing executive at [employer].  He is working at [employer] in [place].     His wife is due March 2023.    He takes Vit D and B12 daily.       MS Histroy:  He had the onset of vertigo in August 2021 and improved some with meclizine.   Shortly thereafter, he noted blurry vision  while running in early September.  The visual symptoms persisted he had an MRI of the brain performed 05/28/2020 which showed multiple foci c/w MS, some that enhanced.    He had noted minimal balance issues off/on over the last year but was going up and down stairs without issues and carried on all his normal activities.   He had no numbness, tingling or weakness.   He had no bladder vision.    After the abnormal brain MRI, he was admitted to [facility] 05/29/19 and received 3 days of IV Solu-Medrol.  Additionally, he had MRI of the cervical thoracic spine showing additional lesions including 1 enhancing lesion to the right adjacent to C6.  He tolerated the steroid well and felt some improvement of symptoms.  He has not had any new symptoms since that time.  I saw him shortly after discharge and treatment options were discussed.  He opted to begin Ocrevus.  His first dose was October 2021  No FH of MS,   His father had B12 deficiency and needed treatment.    He is otherwise in good health.      MRI images/Labs MRI brain 05/28/2020 shows multiple T2/FLAIR hyperintense foci.  Foci are  present in the left middle cerebellar peduncle, cerebellar vermis, and in the periventricular, juxtacortical deep white matter of the hemispheres.  Several foci enhance after contrast.  The largest is in the posterior left frontal lobe.  There are also a few punctate enhancing foci in the hemispheres including a periventricular enhancing focus on the right and a juxtacortical focus in the left parietal lobe.  MRI of the cervical spine 05/30/2020 showed an enhancing focus adjacent to C6 another T2 hyperintense foci that are chronic.  These are located anteriorly adjacent to C3-C4, posterior laterally to the right adjacent to C4, posterior laterally to the left adjacent to C5, anteriorly adjacent to C5, laterally to the right adjacent to C6 (enhancing consistent with acute focus), posterior laterally to the left adjacent to C7, centrally  adjacent to T1,  MRI of the thoracic spine 05/30/2020 shows several T2 hyperintense foci consistent with chronic demyelinating plaque.  No enhancing lesions.   These are located centrally adjacent to T1, posterolaterally to the left adjacent to T9,  MRI of the brain 02/26/2021 shows T2/flair hyperintense lesions in the left middle cerebellar peduncle and in the periventricular, juxtacortical and deep white matter of both hemispheres.  None of the foci enhance.  Compared to the MRI from 05/28/2020, there do not appear to be any new lesions.  The foci that enhanced in 2021 no longer do so  MRI of the cervical spine 02/26/2021 shows T2 hyperintense foci anteriorly at C3-C4, posterolaterally to the right at C4, posterolaterally to the left at C4 C5-C5, to the right at C5, to the right and the left at C5-C6, posterolaterally to the left at C6-C7 and possibly a focus to the right at that level, anterocentrally at T1.  None of the foci enhance.  Compared to the MRI from 05/30/2020 and compared side-by-side, there were no new lesions.  The focus that enhanced in 2021 no longer does  Laboratory tests 05/28/2020 through 05/30/2020 show negative PCR test for SARS-CoV-2.  HIV negative.  SSA/SSB negative, Vitamin D was 38.   REVIEW OF SYSTEMS: Constitutional: No fevers, chills, sweats, or change in appetite Eyes: No visual changes, double vision, eye pain Ear, nose and throat: No hearing loss, ear pain, nasal congestion, sore throat Cardiovascular: No chest pain, palpitations Respiratory:  No shortness of breath at rest or with exertion.   No wheezes GastrointestinaI: No nausea, vomiting, diarrhea, abdominal pain, fecal incontinence Genitourinary:  No dysuria, urinary retention or frequency.  No nocturia. Musculoskeletal:  No neck pain, back pain Integumentary: No rash, pruritus, skin lesions Neurological: as above Psychiatric: No depression at this time.  No anxiety Endocrine: No palpitations, diaphoresis,  change in appetite, change in weigh or increased thirst Hematologic/Lymphatic:  No anemia, purpura, petechiae. Allergic/Immunologic: No itchy/runny eyes, nasal congestion, recent allergic reactions, rashes  ALLERGIES: No Known Allergies  HOME MEDICATIONS:  Current Outpatient Medications:    acetaminophen (TYLENOL) 500 MG tablet, Take 500 mg by mouth every 6 (six) hours as needed for moderate pain., Disp: , Rfl:    fexofenadine (ALLEGRA) 180 MG tablet, Take 180 mg by mouth as needed., Disp: , Rfl:    hyoscyamine (LEVSIN) 0.125 MG tablet, Take 1 tablet (0.125 mg total) by mouth every 4 (four) hours as needed (for abdominal cramping. 4 doses a day maximum.)., Disp: 90 tablet, Rfl: 5   ocrelizumab (OCREVUS) 300 MG/10ML injection, Inject into the vein every 6 (six) months. Initial dose: 300mg  IV on day 1; repeat on day 15 Maintenance dose: 600mg  IV q  6 months, Disp: , Rfl:    vitamin B-12 (CYANOCOBALAMIN) 1000 MCG tablet, Take 1 tablet (1,000 mcg total) by mouth daily., Disp: 90 tablet, Rfl: 3   Vitamin D3 (VITAMIN D) 25 MCG tablet, Take 1 tablet (1,000 Units total) by mouth daily., Disp: 90 tablet, Rfl: 3  PAST MEDICAL HISTORY: Past Medical History:  Diagnosis Date   IBS (irritable colon syndrome)    Constipation predominant. Constipation- with BM abdominal pain improves. Comes in waves with higher stress. worse with less exercise. Plan: increase exercise, watch FODMAP, consider metamucil if that doesnt work   Multiple sclerosis ([place])     PAST SURGICAL HISTORY: Past Surgical History:  Procedure Laterality Date   ADENOIDECTOMY     2006   TONSILLECTOMY      FAMILY HISTORY: Family History  Problem Relation Age of Onset   Healthy Mother        and father   Hyperlipidemia Other        grandparent   Hypertension Other        grandparent   Diabetes Other        grandparent   Hypothyroidism Other    ADD / ADHD Brother    Other Father        Vit B12 def    SOCIAL  HISTORY:  Social History   Socioeconomic History   Marital status: Married    Spouse name: Not on file   Number of children: Not on file   Years of education: Not on file   Highest education level: Not on file  Occupational History   Not on file  Tobacco Use   Smoking status: Never   Smokeless tobacco: Never  Substance and Sexual Activity   Alcohol use: Yes    Alcohol/week: 0.0 standard drinks    Comment: 1-2 per week   Drug use: No   Sexual activity: Not on file  Other Topics Concern   Not on file  Social History Narrative   Family: Single. Not dating right now.       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    [place]- for Emergency planning/management officer 2020   [place]. High achieving student      Hobbies: exercise- lifting at gym at least 2-3x a week, video games on xbox and PC, [employer]- Chiropractor      Prior PCP: Nena Polio, [place] in [place]   Right handed   Coffee- stopped recently    Social Determinants of Health   Financial Resource Strain: Not on file  Food Insecurity: Not on file  Transportation Needs: Not on file  Physical Activity: Not on file  Stress: Not on file  Social Connections: Not on file  Intimate Partner Violence: Not on file     PHYSICAL EXAM  Vitals:   09/21/21 0820  BP: 131/76  Pulse: 64  Weight: 155 lb (70.3 kg)  Height: 5\' 11"  (1.803 m)    Body mass index is 21.62 kg/m.   General: The patient is well-developed and well-nourished and in no acute distress  HEENT:  Head is [place]/AT.  Sclera are anicteric.    Skin: Extremities are without rash or  edema.  Neurologic Exam  Mental status: The patient is alert and oriented x 3 at the time of the examination. The patient has apparent normal recent and remote memory, with an apparently normal attention span  and concentration ability.   Speech is normal.  Cranial  nerves: Extraocular movements are full.  Color vision was symmetric.  Facial strength is normal.  Trapezius and sternocleidomastoid strength is normal. No dysarthria is noted.  No obvious hearing deficits noted.  Motor:  Muscle bulk is normal.   Tone is normal. Strength is  5 / 5 in all 4 extremities.   Sensory: Sensory testing is intact to pinprick, soft touch and vibration sensation in all 4 extremities.  Coordination: Cerebellar testing reveals good finger-nose-finger and heel-to-shin bilaterally.  Gait and station: Station is normal.   Gait is normal.  The tandem gait is normal.  Romberg is negative.  Reflexes: Deep tendon reflexes are symmetric but increased, 3 in the arms and 3+ at the knees with spread and 1 beat of nonsustained clonus at the ankles.        DIAGNOSTIC DATA (LABS, IMAGING, TESTING) - I reviewed patient records, labs, notes, testing and imaging myself where available.  Lab Results  Component Value Date   WBC 4.1 03/16/2021   HGB 17.2 03/16/2021   HCT 50.9 03/16/2021   MCV 87 03/16/2021   PLT 190 03/16/2021      Component Value Date/Time   NA 138 05/30/2020 0546   K 4.2 05/30/2020 0546   CL 106 05/30/2020 0546   CO2 20 (L) 05/30/2020 0546   GLUCOSE 119 (H) 05/30/2020 0546   BUN 19 05/30/2020 0546   CREATININE 0.78 05/30/2020 0546   CALCIUM 9.6 05/30/2020 0546   PROT 6.9 06/06/2019 0835   ALBUMIN 5.0 06/06/2019 0835   AST 16 06/06/2019 0835   ALT 12 06/06/2019 0835   ALKPHOS 59 06/06/2019 0835   BILITOT 2.3 (H) 06/06/2019 0835   GFRNONAA >60 05/30/2020 0546   GFRAA >60 05/30/2020 0546   Lab Results  Component Value Date   CHOL 114 06/06/2019   HDL 44.20 06/06/2019   LDLCALC 59 06/06/2019   TRIG 50.0 06/06/2019   CHOLHDL 3 06/06/2019   No results found for: HGBA1C Lab Results  Component Value Date   VITAMINB12 324 05/29/2020   No results found for: TSH     ASSESSMENT AND PLAN  Multiple sclerosis, relapsing-remitting (HCC) - Plan:  IgG, IgA, IgM, CBC with Differential/Platelet  High risk medication use - Plan: IgG, IgA, IgM, CBC with Differential/Platelet  Vitamin D deficiency - Plan: VITAMIN D 25 Hydroxy (Vit-D Deficiency, Fractures)  Urinary frequency  Right optic neuropathy  1.   He has an aggressive MS presenting with multiple lesions of the spinal cord including 1 acute focus that was probably causing symptoms of presentation.  Additionally, he has some infratentorial lesions.    He has started Ocrevus and has done well.  Specifically he has not had any exacerbations or new neurologic symptoms.  We will continue Ocrevus.  I will check some labs including CBC with differential, IgG/IgM 2.   Stay active and exercise as tolerated.  We will check vitamin D to determine if a higher dose of supplementation is necessary. 3.   If bladder issues worsen consider an anticholinergic agent. 4.   He will return to see me in 6 months or sooner for new or worsening neurologic symptoms.    Omega Slager A. Felecia Shelling, MD, [facility] [id number], [id number] AM Certified in Neurology, Clinical Neurophysiology, Sleep Medicine and Neuroimaging  [facility] [address], [place], [place] [address] [phone]

## 2021-09-21 NOTE — Progress Notes (Signed)
Phone: [phone]    Subjective:  Patient presents today for their annual physical. Chief complaint-noted.   See problem oriented charting- ROS- full  review of systems was completed and negative per full ROS sheet. No blurry vision even with running as he had prior to ocrevus  The following were reviewed and entered/updated in epic: Past Medical History:  Diagnosis Date   IBS (irritable colon syndrome)    Constipation predominant. Constipation- with BM abdominal pain improves. Comes in waves with higher stress. worse with less exercise. Plan: increase exercise, watch FODMAP, consider metamucil if that doesnt work   Multiple sclerosis [facility])    Patient Active Problem List   Diagnosis Date Noted   Right optic neuropathy 09/15/2020    Priority: High   Multiple sclerosis, relapsing-remitting ([place]) 05/29/2020    Priority: High   Vitamin D deficiency 09/21/2021    Priority: Medium    IBS (irritable colon syndrome)     Priority: Medium    Right leg pain 07/02/2014    Priority: Low   High risk medication use 09/15/2020   Past Surgical History:  Procedure Laterality Date   ADENOIDECTOMY     2006   TONSILLECTOMY      Family History  Problem Relation Age of Onset   Healthy Mother        and father   Hyperlipidemia Other        grandparent   Hypertension Other        grandparent   Diabetes Other        grandparent   Hypothyroidism Other    ADD / ADHD Brother    Other Father        Vit B12 def    Medications- reviewed and updated Current Outpatient Medications  Medication Sig Dispense Refill   acetaminophen (TYLENOL) 500 MG tablet Take 500 mg by mouth every 6 (six) hours as needed for moderate pain.     fexofenadine (ALLEGRA) 180 MG tablet Take 180 mg by mouth as needed.     hyoscyamine (LEVSIN) 0.125 MG tablet Take 1 tablet (0.125 mg total) by mouth every 4 (four) hours as needed (for abdominal cramping. 4 doses a day maximum.). 90 tablet 5   ocrelizumab (OCREVUS)  300 MG/10ML injection Inject into the vein every 6 (six) months. Initial dose: 337m IV on day 1; repeat on day 15 Maintenance dose: 6048mIV q 6 months     vitamin B-12 (CYANOCOBALAMIN) 1000 MCG tablet Take 1 tablet (1,000 mcg total) by mouth daily. 90 tablet 3   Vitamin D3 (VITAMIN D) 25 MCG tablet Take 1 tablet (1,000 Units total) by mouth daily. 90 tablet 3   No current facility-administered medications for this visit.    Allergies-reviewed and updated No Known Allergies  Social History   Social History Narrative   Family: Marred. Child on way November 13 2021. 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 Oropharynx normal Neck: no thyromegaly CV: RRR no murmurs rubs or gallops Lungs: CTAB no crackles, wheeze, rhonchi Abdomen: soft/nontender/nondistended/normal bowel sounds. No rebound or guarding.  Ext: no edema Skin: warm, dry Neuro: grossly normal, moves all extremities, PERRLA     Assessment and Plan:  26 y.o. male presenting for annual physical.  Health Maintenance counseling: 1. Anticipatory guidance: Patient counseled regarding regular dental exams -q6 months with dad, eye exams - saw optometrist fall 2021 and then optho sept 17 2021,  avoiding smoking and second hand smoke , limiting alcohol to 2 beverages per day- rare social, no illicit drugs.   2. Risk factor reduction:  Advised patient of need for regular exercise and diet  rich and fruits and vegetables to reduce risk of heart attack and stroke.  Exercise- 2 days a week at gym plus daily walking.  Diet/weight management- reasonably healthy.   Wt Readings from Last 3 Encounters:  09/21/21 152 lb 3.2 oz (69 kg)  09/21/21 155 lb (70.3 kg)  03/16/21 145 lb (65.8 kg)  3. Immunizations/screenings/ancillary studies DISCUSSED:  -COVID booster vaccination #2 - he will send [place] date of covid vaccine- had 2 pfizer and 1 booster -Hepatitis C Screening - defer for now -Prevnar-20 vaccination #1 - today Immunization History  Administered Date(s) Administered   DTaP 01/29/1998, 04/09/1998, 05/27/1998, 02/28/1999, 03/18/2002   HPV Quadrivalent 03/17/2011, 04/08/2013, 04/16/2014   Hepatitis A, Ped/Adol-2 Dose 03/17/2011, 04/08/2013   Hepatitis B 10/29/1997, 12/28/1997, 10/19/1998   HiB (PRP-T) 01/29/1998, 04/09/1998, 05/27/1998, 02/28/1999   IPV 01/29/1998, 04/09/1998, 05/27/1998, 03/18/2002   Influenza,inj,Quad PF,6+ Mos 05/23/2019, 06/25/2021   Influenza-Unspecified 08/03/2004, 07/12/2007, 07/05/2012, 06/07/2013, 06/06/2014, 04/26/2015, 06/27/2015, 05/12/2017   Janssen (J&J) SARS-COV-2 Vaccination 12/18/2019   MMR 12/06/1998, 03/18/2002   Meningococcal B, OMV 11/11/2015   Meningococcal Conjugate 01/07/2009, 04/16/2014, 12/13/2015   Td 01/07/2009   Tdap 01/07/2009, 06/06/2019   Varicella 12/06/1998, 04/08/2013  4. Prostate cancer screening-  No family history, start at age 24    5. Colon cancer screening - no family history, start at age 98   6. Skin cancer screening/prevention- Monitoring back-a reddish 8 x 6 mm lesion- stable in 2020 and again today. advised regular sunscreen use. Denies worrisome, changing, or new skin lesions.  7. Testicular cancer screening- advised monthly self exams . No change since 2018 ultrasound- epididymal cysts 8. STD screening- only active with wfie 9. Smoking associated screening- Never smoker  Status of chronic or acute concerns    #Multiple sclerosis S: Medication: On Ocrevus injections-no exacerbations or new neurological symptoms since starting thankfully.  Saw Dr. Felecia Shelling this morning A/P: thankfully stable- continue current meds and follow up with Dr. Felecia Shelling  -just had bloodwork- we will get cmp next visit regardless though (not done on his labs- vit d, cbc, igg, iga, igm)  #Vitamin D deficiency S: Medication: 1000 units daily Last vitamin D Lab Results  Component Value Date   VD25OH 38.47 05/29/2020  A/P: hopefully stable- updated vitamin D today with Dr. Felecia Shelling. Continue current meds for now   #IBS -reported good first visit here November 11, 2015- had reassuring CT scan in the past-I do not see that we have obtained records.  Worsened with periods of high stress.  Symptoms resolved after bowel movement typically.  Taking Hyocyamine 0.125 mg as needed- can constipate him though -still comes and  goes  #stress somewhat better- getting out of school has bene helpful. Wife and parents big support. Did therpay in college not needing at moment  #Canker sores- fluocinonide from his dentist- somewhat was helpful- discussed decreasing stress. No cold sores/fever blisters in years  #Family history of low B12-patient takes B12 empirically. B12 low normal 05/29/20  Recommended follow up: No follow-ups on file. Future Appointments  Date Time Provider [place]  03/29/2022  8:30 AM Sater, Nanine Means, MD [facility] None   Lab/Order associations: NOT fasting- but not doing labs today   ICD-10-CM   1. Preventative health care  Z00.00     2. Irritable bowel syndrome with constipation  K58.1     3. Anxiety  F41.9       No orders of the defined types were placed in this encounter.  I,Jada Bradford,acting as a scribe for Garret Reddish, MD.,have documented all relevant documentation on the behalf of Garret Reddish, MD,as directed by  Garret Reddish, MD while in the presence of Garret Reddish, MD.  I, Garret Reddish,  MD, have reviewed all documentation for this visit. The documentation on 09/21/21 for the exam, diagnosis, procedures, and orders are all accurate and complete.  Return precautions advised.   Garret Reddish, MD

## 2021-09-21 NOTE — Patient Instructions (Addendum)
Health Maintenance Due  Topic Date Due   Pneumococcal Vaccine 17-24 Years old (1 - PCV)  Prevnar 78 today Never done   Thrilled you are doing so well!   Send [place] dates of covid vaccines. If you have not had the bivalent vaccination- reasonable to do that maybe 3 weeks out from today  Recommended follow up: Return in about 1 year (around 09/21/2022) for physical or sooner if needed.

## 2021-09-21 NOTE — Addendum Note (Signed)
Addended by: Lieutenant Diego A on: 09/21/2021 10:59 AM   Modules accepted: Orders

## 2021-09-22 LAB — CBC WITH DIFFERENTIAL/PLATELET
Basophils Absolute: 0 10*3/uL (ref 0.0–0.2)
Basos: 1 %
EOS (ABSOLUTE): 0.1 10*3/uL (ref 0.0–0.4)
Eos: 3 %
Hematocrit: 47.6 % (ref 37.5–51.0)
Hemoglobin: 16.9 g/dL (ref 13.0–17.7)
Immature Grans (Abs): 0 10*3/uL (ref 0.0–0.1)
Immature Granulocytes: 0 %
Lymphocytes Absolute: 0.9 10*3/uL (ref 0.7–3.1)
Lymphs: 24 %
MCH: 30.8 pg (ref 26.6–33.0)
MCHC: 35.5 g/dL (ref 31.5–35.7)
MCV: 87 fL (ref 79–97)
Monocytes Absolute: 0.5 10*3/uL (ref 0.1–0.9)
Monocytes: 13 %
Neutrophils Absolute: 2.3 10*3/uL (ref 1.4–7.0)
Neutrophils: 59 %
Platelets: 170 10*3/uL (ref 150–450)
RBC: 5.48 x10E6/uL (ref 4.14–5.80)
RDW: 13.2 % (ref 11.6–15.4)
WBC: 3.8 10*3/uL (ref 3.4–10.8)

## 2021-09-22 LAB — IGG, IGA, IGM
IgA/Immunoglobulin A, Serum: 121 mg/dL (ref 90–386)
IgG (Immunoglobin G), Serum: 823 mg/dL (ref 603–1613)
IgM (Immunoglobulin M), Srm: 39 mg/dL (ref 20–172)

## 2021-09-22 LAB — VITAMIN D 25 HYDROXY (VIT D DEFICIENCY, FRACTURES): Vit D, 25-Hydroxy: 34.1 ng/mL (ref 30.0–100.0)

## 2021-09-23 ENCOUNTER — Encounter: Payer: Self-pay | Admitting: Family Medicine

## 2022-02-19 ENCOUNTER — Encounter: Payer: Self-pay | Admitting: Neurology

## 2022-02-20 ENCOUNTER — Other Ambulatory Visit: Payer: Self-pay | Admitting: Neurology

## 2022-02-20 DIAGNOSIS — G35 Multiple sclerosis: Secondary | ICD-10-CM

## 2022-02-23 ENCOUNTER — Telehealth: Payer: Self-pay | Admitting: Neurology

## 2022-02-23 NOTE — Telephone Encounter (Signed)
[employer] Auth: [id number] exp. 02/23/2022-03/24/2022

## 2022-03-22 ENCOUNTER — Ambulatory Visit
Admission: RE | Admit: 2022-03-22 | Discharge: 2022-03-22 | Disposition: A | Discharge status: Home / Self Care | Payer: BC Managed Care – PPO | Source: Ambulatory Visit | Attending: Neurology | Admitting: Neurology

## 2022-03-22 DIAGNOSIS — G35 Multiple sclerosis: Secondary | ICD-10-CM

## 2022-03-22 MED ORDER — GADOBENATE DIMEGLUMINE 529 MG/ML IV SOLN
14.0000 mL | Freq: Once | INTRAVENOUS | Status: AC | PRN
Start: 2022-03-22 — End: 2022-03-22
  Administered 2022-03-22: 14 mL via INTRAVENOUS

## 2022-03-29 ENCOUNTER — Ambulatory Visit (INDEPENDENT_AMBULATORY_CARE_PROVIDER_SITE_OTHER): Payer: BC Managed Care – PPO | Admitting: Neurology

## 2022-03-29 ENCOUNTER — Encounter: Payer: Self-pay | Admitting: Neurology

## 2022-03-29 VITALS — BP 129/70 | HR 70 | Ht 71.0 in | Wt 152.2 lb

## 2022-03-29 DIAGNOSIS — E559 Vitamin D deficiency, unspecified: Secondary | ICD-10-CM

## 2022-03-29 DIAGNOSIS — Z79899 Other long term (current) drug therapy: Secondary | ICD-10-CM | POA: Diagnosis not present

## 2022-03-29 DIAGNOSIS — R350 Frequency of micturition: Secondary | ICD-10-CM

## 2022-03-29 DIAGNOSIS — G35 Multiple sclerosis: Secondary | ICD-10-CM | POA: Diagnosis not present

## 2022-03-29 DIAGNOSIS — H469 Unspecified optic neuritis: Secondary | ICD-10-CM

## 2022-03-29 NOTE — Progress Notes (Signed)
[facility]  PATIENT: Albert Edwards DOB: 1996-07-05  REFERRING DOCTOR OR PCP:  Garret Reddish / Roland Rack SOURCE: Patient, notes from hospitalization and PCP, imaging laboratory reports, MRI images personally reviewed and interpreted.  _________________________________   HISTORICAL  CHIEF COMPLAINT:  Chief Complaint  Patient presents with   Follow-up    Rm 18, alone. Here for 6 month MS f/u, on Ocrevus and tolerating well. Last infusion date: 02/13/2022 and Next infusion date: 08/14/2022. Pt has been having heart burn, on Prilosec. Has had new onset of pn.     HISTORY OF PRESENT ILLNESS:  Albert Edwards is a 26 y.o. man with relapsing remitting multiple sclerosis.  Update 03/29/2022: He started Ocrevus November 2021 and had his last infusion June 2023, next one in December.     He has not had any exacerbaions or new neurologic symptoms.    He had moderate infusion reactions first dose but nothing significant last time.   He has had some heartburn and takes Prilosec,     He has had some pinch sensation in his arms.     H ehas noted some visual symptos and brain fog - slightly worse.    Symptoms are worse when more tired or stressed.     I personally reviewed his recent brain and cervical spine MRI performed couple weeks ago.  There were no new lesions.  2 enhancing lesions in 2021 no longer do and have an improved appearance  Gait and balance are doing well.   No stumbles or falls.   He sometimes  holds the bannister, especially if he holds th baby on the stairs.    He walks a few mile daily. Strength and sensation are doing well.   He only rarely has  a Lhermitte sign.   Blurry vision improved but still bothers him at time - right eye   He has urinary frequency but no incontinence.   He has nocturia x 2.     Not worse and not bad enough to treat.     He does not have too much fatigue most days.Marland Kitchen   He sleeps well.   He exercises but less than last  visit.Marland Kitchen  He does everything he needs to and wants to do.   Mood is fine.   He has word finding difficulty but not enough to cause issues.     He did an Marketing executive at [employer].  He is working at [employer] in [place].     He has a newborn son March 2023.  He takes Vit D and B12 daily.       MS Histroy:  He had the onset of vertigo in August 2021 and improved some with meclizine.   Shortly thereafter, he noted blurry vision while running in early September.  The visual symptoms persisted he had an MRI of the brain performed 05/28/2020 which showed multiple foci c/w MS, some that enhanced.    He had noted minimal balance issues off/on over the last year but was going up and down stairs without issues and carried on all his normal activities.   He had no numbness, tingling or weakness.   He had no bladder vision.    After the abnormal brain MRI, he was admitted to [facility] 05/29/19 and received 3 days of IV Solu-Medrol.  Additionally, he had MRI of the cervical thoracic spine showing additional lesions including 1 enhancing lesion to the right adjacent to C6.  He tolerated the  steroid well and felt some improvement of symptoms.  He has not had any new symptoms since that time.  I saw him shortly after discharge and treatment options were discussed.  He opted to begin Ocrevus.  His first dose was October 2021  No FH of MS,   His father had B12 deficiency and needed treatment.    He is otherwise in good health.      MRI images/Labs MRI brain 05/28/2020 shows multiple T2/FLAIR hyperintense foci.  Foci are present in the left middle cerebellar peduncle, cerebellar vermis, and in the periventricular, juxtacortical deep white matter of the hemispheres.  Several foci enhance after contrast.  The largest is in the posterior left frontal lobe.  There are also a few punctate enhancing foci in the hemispheres including a periventricular enhancing focus on the right and a juxtacortical focus in the left  parietal lobe.  MRI of the cervical spine 05/30/2020 showed an enhancing focus adjacent to C6 another T2 hyperintense foci that are chronic.  These are located anteriorly adjacent to C3-C4, posterior laterally to the right adjacent to C4, posterior laterally to the left adjacent to C5, anteriorly adjacent to C5, laterally to the right adjacent to C6 (enhancing consistent with acute focus), posterior laterally to the left adjacent to C7, centrally adjacent to T1,  MRI of the thoracic spine 05/30/2020 shows several T2 hyperintense foci consistent with chronic demyelinating plaque.  No enhancing lesions.   These are located centrally adjacent to T1, posterolaterally to the left adjacent to T9,  MRI of the brain 02/26/2021 shows T2/flair hyperintense lesions in the left middle cerebellar peduncle and in the periventricular, juxtacortical and deep white matter of both hemispheres.  None of the foci enhance.  Compared to the MRI from 05/28/2020, there do not appear to be any new lesions.  The foci that enhanced in 2021 no longer do so  MRI of the cervical spine 02/26/2021 shows T2 hyperintense foci anteriorly at C3-C4, posterolaterally to the right at C4, posterolaterally to the left at C4 C5-C5, to the right at C5, to the right and the left at C5-C6, posterolaterally to the left at C6-C7 and possibly a focus to the right at that level, anterocentrally at T1.  None of the foci enhance.  Compared to the MRI from 05/30/2020 and compared side-by-side, there were no new lesions.  The focus that enhanced in 2021 no longer does  MRI of the brain and cervical spine 03/22/2022 showed no new lesions.   Laboratory tests  05/28/2020 through 05/30/2020 show negative PCR test for SARS-CoV-2.  HIV negative.  SSA/SSB negative, Vitamin D was 38.   REVIEW OF SYSTEMS: Constitutional: No fevers, chills, sweats, or change in appetite Eyes: No visual changes, double vision, eye pain Ear, nose and throat: No hearing loss, ear pain,  nasal congestion, sore throat Cardiovascular: No chest pain, palpitations Respiratory:  No shortness of breath at rest or with exertion.   No wheezes GastrointestinaI: No nausea, vomiting, diarrhea, abdominal pain, fecal incontinence Genitourinary:  No dysuria, urinary retention or frequency.  No nocturia. Musculoskeletal:  No neck pain, back pain Integumentary: No rash, pruritus, skin lesions Neurological: as above Psychiatric: No depression at this time.  No anxiety Endocrine: No palpitations, diaphoresis, change in appetite, change in weigh or increased thirst Hematologic/Lymphatic:  No anemia, purpura, petechiae. Allergic/Immunologic: No itchy/runny eyes, nasal congestion, recent allergic reactions, rashes  ALLERGIES: No Known Allergies  HOME MEDICATIONS:  Current Outpatient Medications:    acetaminophen (TYLENOL) 500 MG tablet,  Take 500 mg by mouth every 6 (six) hours as needed for moderate pain., Disp: , Rfl:    fexofenadine (ALLEGRA) 180 MG tablet, Take 180 mg by mouth as needed., Disp: , Rfl:    hyoscyamine (LEVSIN) 0.125 MG tablet, Take 1 tablet (0.125 mg total) by mouth every 4 (four) hours as needed (for abdominal cramping. 4 doses a day maximum.)., Disp: 90 tablet, Rfl: 5   ocrelizumab (OCREVUS) 300 MG/10ML injection, Inject into the vein every 6 (six) months. Initial dose: 300mg  IV on day 1; repeat on day 15 Maintenance dose: 600mg  IV q 6 months, Disp: , Rfl:    omeprazole (PRILOSEC) 20 MG capsule, Take 20 mg by mouth daily., Disp: , Rfl:    vitamin B-12 (CYANOCOBALAMIN) 1000 MCG tablet, Take 1 tablet (1,000 mcg total) by mouth daily., Disp: 90 tablet, Rfl: 3   Vitamin D3 (VITAMIN D) 25 MCG tablet, Take 1 tablet (1,000 Units total) by mouth daily., Disp: 90 tablet, Rfl: 3  PAST MEDICAL HISTORY: Past Medical History:  Diagnosis Date   IBS (irritable colon syndrome)    Constipation predominant. Constipation- with BM abdominal pain improves. Comes in waves with higher stress.  worse with less exercise. Plan: increase exercise, watch FODMAP, consider metamucil if that doesnt work   Multiple sclerosis (HCC)     PAST SURGICAL HISTORY: Past Surgical History:  Procedure Laterality Date   ADENOIDECTOMY     2006   TONSILLECTOMY      FAMILY HISTORY: Family History  Problem Relation Age of Onset   Healthy Mother        and father   Hyperlipidemia Other        grandparent   Hypertension Other        grandparent   Diabetes Other        grandparent   Hypothyroidism Other    ADD / ADHD Brother    Other Father        Vit B12 def    SOCIAL HISTORY:  Social History   Socioeconomic History   Marital status: Married    Spouse name: Not on file   Number of children: Not on file   Years of education: Not on file   Highest education level: Not on file  Occupational History   Not on file  Tobacco Use   Smoking status: Never   Smokeless tobacco: Never  Substance and Sexual Activity   Alcohol use: Yes    Alcohol/week: 0.0 standard drinks of alcohol    Comment: 1-2 per week   Drug use: No   Sexual activity: Not on file  Other Topics Concern   Not on file  Social History Narrative   Family: Marred. Child on way November 13 2021. 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    [employer]- for Programmer, multimedia 2020   [employer]- [employer]. High achieving student      Hobbies: exercise, video games on xbox and PC, switch, learning to play guitar      Prior PCP: Primus Bravo, FNP [facility] in [place]   Social Determinants of Health   Financial Resource Strain: Not on file  Food Insecurity: Not on file  Transportation Needs: Not on file  Physical Activity: Not on file  Stress: Not on file  Social Connections: Not on file  Intimate Partner Violence: Not on file  PHYSICAL EXAM  Vitals:   03/29/22 0817  BP: 129/70  Pulse: 70  Weight:  152 lb 3.2 oz (69 kg)  Height: 5\' 11"  (1.803 m)    Body mass index is 21.23 kg/m.   General: The patient is well-developed and well-nourished and in no acute distress  HEENT:  Head is [place]/AT.  Sclera are anicteric.    Skin: Extremities are without rash or  edema.  Neurologic Exam  Mental status: The patient is alert and oriented x 3 at the time of the examination. The patient has apparent normal recent and remote memory, with an apparently normal attention span and concentration ability.   Speech is normal.  Cranial nerves: Extraocular movements are full.  Color vision was symmetric.  Facial strength is normal.  Trapezius and sternocleidomastoid strength is normal. No dysarthria is noted.  No obvious hearing deficits noted.  Motor:  Muscle bulk is normal.   Tone is normal. Strength is  5 / 5 in all 4 extremities.   Sensory: Sensory testing is intact to pinprick, soft touch and vibration sensation in all 4 extremities.  Coordination: Cerebellar testing reveals good finger-nose-finger and heel-to-shin bilaterally.  Gait and station: Station is normal.   Gait is normal.  The tandem gait is minimally wide.  Romberg is negative.  Reflexes: Deep tendon reflexes are symmetric but increased, 3 in the arms and 3+ at the knees with spread and no clonus.     DIAGNOSTIC DATA (LABS, IMAGING, TESTING) - I reviewed patient records, labs, notes, testing and imaging myself where available.  Lab Results  Component Value Date   WBC 3.8 09/21/2021   HGB 16.9 09/21/2021   HCT 47.6 09/21/2021   MCV 87 09/21/2021   PLT 170 09/21/2021      Component Value Date/Time   NA 138 05/30/2020 0546   K 4.2 05/30/2020 0546   CL 106 05/30/2020 0546   CO2 20 (L) 05/30/2020 0546   GLUCOSE 119 (H) 05/30/2020 0546   BUN 19 05/30/2020 0546   CREATININE 0.78 05/30/2020 0546   CALCIUM 9.6 05/30/2020 0546   PROT 6.9 06/06/2019 0835   ALBUMIN 5.0 06/06/2019 0835   AST 16 06/06/2019 0835   ALT 12  06/06/2019 0835   ALKPHOS 59 06/06/2019 0835   BILITOT 2.3 (H) 06/06/2019 0835   GFRNONAA >60 05/30/2020 0546   GFRAA >60 05/30/2020 0546   Lab Results  Component Value Date   CHOL 114 06/06/2019   HDL 44.20 06/06/2019   LDLCALC 59 06/06/2019   TRIG 50.0 06/06/2019   CHOLHDL 3 06/06/2019   No results found for: "HGBA1C" Lab Results  Component Value Date   VITAMINB12 324 05/29/2020   No results found for: "TSH"     ASSESSMENT AND PLAN  Multiple sclerosis, relapsing-remitting (HCC) - Plan: IgG, IgA, IgM, CBC with Differential/Platelet  High risk medication use - Plan: IgG, IgA, IgM, CBC with Differential/Platelet  Vitamin D deficiency  Urinary frequency  Right optic neuropathy  1.    We will continue Ocrevus.  I will check some labs including CBC with differential, IgG/IgM MRI recently showed no new lesions since starting Ocrevus. 2.   Stay active and exercise as tolerated.   3.   If bladder issues worsen consider an anticholinergic agent. 4.   He will return to see me in 6 months or sooner for new or worsening neurologic symptoms.    Joliene Salvador A. 05/31/2020, MD, [facility] 03/29/2022, 9:04 AM Certified in Neurology, Clinical Neurophysiology, Sleep Medicine and  Neuroimaging  [facility] [address], [place], [place] [address] [phone]

## 2022-03-30 LAB — CBC WITH DIFFERENTIAL/PLATELET
Basophils Absolute: 0 10*3/uL (ref 0.0–0.2)
Basos: 1 %
EOS (ABSOLUTE): 0.1 10*3/uL (ref 0.0–0.4)
Eos: 2 %
Hematocrit: 46.5 % (ref 37.5–51.0)
Hemoglobin: 16 g/dL (ref 13.0–17.7)
Immature Grans (Abs): 0 10*3/uL (ref 0.0–0.1)
Immature Granulocytes: 0 %
Lymphocytes Absolute: 0.8 10*3/uL (ref 0.7–3.1)
Lymphs: 13 %
MCH: 30.7 pg (ref 26.6–33.0)
MCHC: 34.4 g/dL (ref 31.5–35.7)
MCV: 89 fL (ref 79–97)
Monocytes Absolute: 0.6 10*3/uL (ref 0.1–0.9)
Monocytes: 9 %
Neutrophils Absolute: 4.6 10*3/uL (ref 1.4–7.0)
Neutrophils: 75 %
Platelets: 172 10*3/uL (ref 150–450)
RBC: 5.22 x10E6/uL (ref 4.14–5.80)
RDW: 13.1 % (ref 11.6–15.4)
WBC: 6.1 10*3/uL (ref 3.4–10.8)

## 2022-03-30 LAB — IGG, IGA, IGM
IgA/Immunoglobulin A, Serum: 97 mg/dL (ref 90–386)
IgG (Immunoglobin G), Serum: 735 mg/dL (ref 603–1613)
IgM (Immunoglobulin M), Srm: 37 mg/dL (ref 20–172)

## 2022-04-08 ENCOUNTER — Other Ambulatory Visit: Payer: BC Managed Care – PPO

## 2022-04-10 ENCOUNTER — Encounter: Payer: Self-pay | Admitting: Family Medicine

## 2022-04-18 ENCOUNTER — Telehealth: Payer: Self-pay | Admitting: Family Medicine

## 2022-04-18 NOTE — Telephone Encounter (Signed)
Called patient to ensure that OV scheduled with PCP on 08/14 for heartburn didn't need immediate attention.   Patient states: - He has been experiencing indigestion and slight chest tightness that he believes to be caused by heartburn - He has also been having a nerve pinching feeling but attributes this to his MS.  - He is having slight fatigue but is getting over a cold and has a child in daycare - Able to complete day to day tasks with no problems  Patient requests:  - Advice on if original OV date, 08/14, would suffice or if PCP had any other recommendations. Please advise.

## 2022-04-18 NOTE — Telephone Encounter (Signed)
See below, ok to keep 08/14 appointment?

## 2022-04-18 NOTE — Telephone Encounter (Signed)
We could change to 11 40 on Wednesday aug 9- can do ekg under chest pain at beginning of visit- tell him I'm likely to be running behind at that time and could be closer to 12 15 or 12 30 but willing to work him in if would work for him

## 2022-04-18 NOTE — Telephone Encounter (Signed)
See below

## 2022-04-18 NOTE — Telephone Encounter (Signed)
Patient moved

## 2022-04-19 ENCOUNTER — Encounter: Payer: Self-pay | Admitting: Family Medicine

## 2022-04-19 ENCOUNTER — Ambulatory Visit: Payer: BC Managed Care – PPO | Admitting: Family Medicine

## 2022-04-19 VITALS — BP 138/62 | HR 73 | Temp 98.3°F | Ht 71.0 in | Wt 146.6 lb

## 2022-04-19 DIAGNOSIS — R0789 Other chest pain: Secondary | ICD-10-CM | POA: Diagnosis not present

## 2022-04-19 DIAGNOSIS — Z79899 Other long term (current) drug therapy: Secondary | ICD-10-CM

## 2022-04-19 DIAGNOSIS — R1013 Epigastric pain: Secondary | ICD-10-CM | POA: Diagnosis not present

## 2022-04-19 DIAGNOSIS — K219 Gastro-esophageal reflux disease without esophagitis: Secondary | ICD-10-CM

## 2022-04-19 LAB — COMPREHENSIVE METABOLIC PANEL
ALT: 21 U/L (ref 0–53)
AST: 20 U/L (ref 0–37)
Albumin: 5.3 g/dL — ABNORMAL HIGH (ref 3.5–5.2)
Alkaline Phosphatase: 63 U/L (ref 39–117)
BUN: 15 mg/dL (ref 6–23)
CO2: 21 mEq/L (ref 19–32)
Calcium: 9.6 mg/dL (ref 8.4–10.5)
Chloride: 104 mEq/L (ref 96–112)
Creatinine, Ser: 0.81 mg/dL (ref 0.40–1.50)
GFR: 123.51 mL/min (ref >60.00)
Glucose, Bld: 92 mg/dL (ref 70–99)
Potassium: 3.9 mEq/L (ref 3.5–5.1)
Sodium: 139 mEq/L (ref 135–145)
Total Bilirubin: 2.1 mg/dL — ABNORMAL HIGH (ref 0.2–1.2)
Total Protein: 7.7 g/dL (ref 6.0–8.3)

## 2022-04-19 LAB — CBC WITH DIFFERENTIAL/PLATELET
Basophils Absolute: 0 10*3/uL (ref 0.0–0.1)
Basophils Relative: 0.5 % (ref 0.0–3.0)
Eosinophils Absolute: 0.1 10*3/uL (ref 0.0–0.7)
Eosinophils Relative: 2.2 % (ref 0.0–5.0)
HCT: 47.9 % (ref 39.0–52.0)
Hemoglobin: 16.2 g/dL (ref 13.0–17.0)
Lymphocytes Relative: 14.5 % (ref 12.0–46.0)
Lymphs Abs: 0.7 10*3/uL (ref 0.7–4.0)
MCHC: 33.8 g/dL (ref 30.0–36.0)
MCV: 87.4 fl (ref 78.0–100.0)
Monocytes Absolute: 0.4 10*3/uL (ref 0.1–1.0)
Monocytes Relative: 8.4 % (ref 3.0–12.0)
Neutro Abs: 3.5 10*3/uL (ref 1.4–7.7)
Neutrophils Relative %: 74.4 % (ref 43.0–77.0)
Platelets: 212 10*3/uL (ref 150.0–400.0)
RBC: 5.49 Mil/uL (ref 4.22–5.81)
RDW: 13.1 % (ref 11.5–15.5)
WBC: 4.7 10*3/uL (ref 4.0–10.5)

## 2022-04-19 LAB — LIPASE: Lipase: 10 U/L — ABNORMAL LOW (ref 11.0–59.0)

## 2022-04-19 LAB — VITAMIN B12: Vitamin B-12: >1500 pg/mL — ABNORMAL HIGH (ref 211–911)

## 2022-04-19 MED ORDER — OMEPRAZOLE 40 MG PO CPDR: 40.0000 mg | DELAYED_RELEASE_CAPSULE | Freq: Every day | ORAL | 3 refills | Status: DC

## 2022-04-19 NOTE — Progress Notes (Signed)
Phone [phone] In person visit   Subjective:   Albert Edwards is a 26 y.o. year old very pleasant male patient who presents for/with See problem oriented charting Chief Complaint  Patient presents with   Gastroesophageal Reflux    Pt states relfux has been about the same, it did go away about a week ago after trial of prilosec. He also is burping more than normal.   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     fexofenadine (ALLEGRA) 180 MG tablet Take 180 mg by mouth as needed.     hyoscyamine (LEVSIN) 0.125 MG tablet Take 1 tablet (0.125 mg total) by mouth every 4 (four) hours as needed (for abdominal cramping. 4 doses a day maximum.). 90 tablet 5   ocrelizumab (OCREVUS) 300 MG/10ML injection Inject into the vein every 6 (six) months. Initial dose: 300mg  IV on day 1; repeat on day 15 Maintenance dose: 600mg  IV q 6 months     omeprazole (PRILOSEC) 40 MG capsule Take 1 capsule (40 mg total) by mouth daily. 30 capsule 3   vitamin B-12 (CYANOCOBALAMIN) 1000 MCG tablet Take 1 tablet (1,000 mcg total) by mouth daily. 90 tablet 3   Vitamin D3 (VITAMIN D) 25 MCG tablet Take 1 tablet (1,000 Units total) by mouth daily. 90 tablet 3   No current facility-administered medications for this visit.     Objective:  BP 138/62   Pulse 73   Temp 98.3 F (36.8 C)   Ht 5\' 11"  (1.803 m)    Wt 146 lb 9.6 oz (66.5 kg)   SpO2 99%   BMI 20.45 kg/m  Gen: NAD, resting comfortably CV: RRR no murmurs rubs or gallops Lungs: CTAB no crackles, wheeze, rhonchi Abdomen: soft/nontender/nondistended/normal bowel sounds. No rebound or guarding.  Ext: no edema Skin: warm, dry  EKG: sinus rhythm with rate 66, normal axis, normal intervals, no hypertrophy, no st or t wave changes     Assessment and Plan    # GERD S: Patient reached out on 04/10/2022 complaining of consistent heartburn/indigestion over the last couple weeks at that time-symptoms resolved on Prilosec (as above) then recurred when stopped after a day or two-he reached out at that time as he preferred not to take long-term and had concerns about taking long-term.   Today he reports had been scuba diving on July 8th and no issues prior to that. In next few days noted a burning in lower chest sometimes into the left chest-occasionally down into epigastric region. Noted some slight fatigue but could still do normal tasks (but also was sick from his son with what sounds like respiratory illness). No clear shortness of breath- still able to go for runs without issues-at times he wonders if perhaps has mild increased windedness. Started prilosec 20 mg OTC- complete relief initially then symptoms recurred when he  stopped medicine  He is back to taking medication omeprazole 20 mg daily but this time has not had complete resolution of pain- mild improvement only. Some scenarios when he felt discomfort/burning more - such as tomato based food but not always linked. Not always worsth after meals but has noted more belching lately though. Lingering low grade burning in chest and occasional sharp left sided chest pain.  No exertional chest pain or shortness of breath noted.  Pain has not worsened while running but did take last 2 eeks off- with brisk walks no issues. Takes prilosec before breakfast. No consistent calf pain or swelling. Does feel  occasional pinching feeling in forearms, lower legs but brief and he had mentioned all of the symptoms to neurology who did not think related to MS  No ibuprofen/aleve in recent months.  A/P: This certainly sounds like poorly controlled reflux-nicely controlled on omeprazole 20 mg but once trialed off and retrialed did not seem to be as effective-this makes me wonder about H. pylori and we will do a stool antigen to test for this - Also increase omeprazole to 40 mg daily for up to 2 months - EKG largely reassuring but we discussed this is not firmly rule out cardiac etiology but that I thought GI origin was much more likely-start with blood work in addition to above plan-if fails to improve consider GI consult and if no GI origin on GI consult we discussed at that point to consider cardiology consult - Discussed with new or worsening symptoms to seek care   Recommended follow up: Return for as needed for new, worsening, persistent symptoms. Future Appointments  Date Time Provider Department Center  09/22/2022  8:00 AM Shelva Majestic, MD [facility]  10/04/2022  8:30 AM Sater, Pearletha Furl, MD [facility] None    Lab/Order associations:   ICD-10-CM   1. Burning in the chest  R07.89 EKG 12-Lead    H. pylori antigen, stool    CBC with Differential/Platelet    Comprehensive metabolic panel    Lipase    2. Gastroesophageal reflux disease, unspecified whether esophagitis present  K21.9 H. pylori antigen, stool    3. Epigastric abdominal pain  R10.13 CBC with Differential/Platelet    Comprehensive metabolic panel    Lipase    4. High risk medication use  Z79.899 Vitamin B12      Meds ordered this encounter  Medications   omeprazole (PRILOSEC) 40 MG capsule    Sig: Take 1 capsule (40 mg total) by mouth daily.    Dispense:  30 capsule    Refill:  3    Return precautions advised.  [name], MD

## 2022-04-19 NOTE — Patient Instructions (Addendum)
Flu shot- we should have these available within a month or two but please let us know if you get at outside pharmacy  Lets do stool test to look for H. Pylori- team please give him collection materials/instructions  If negative may trial stronger dose for a month (sent in omeprazole 40mg  for up to 2 months then go back to 20 mg) and if not improving within 2-3 weeks get GI opinion - would be ok to do closer to [place]  Please stop by lab before you go If you have mychart- we will send your results within 3 business days of receiving them.  If you do not have mychart- we will call you about results within 5 business days of [place] receiving them.  *please also note that you will see labs on mychart as soon as they post. I will later go in and write notes on them- will say "notes from Dr. [place]"    Recommended follow up: Return for as needed for new, worsening, persistent symptoms.

## 2022-04-23 LAB — H. PYLORI ANTIGEN, STOOL: H pylori Ag, Stl: NEGATIVE

## 2022-04-24 ENCOUNTER — Ambulatory Visit: Payer: BC Managed Care – PPO | Admitting: Family Medicine

## 2022-04-28 ENCOUNTER — Ambulatory Visit (INDEPENDENT_AMBULATORY_CARE_PROVIDER_SITE_OTHER): Payer: BC Managed Care – PPO | Admitting: Family Medicine

## 2022-04-28 ENCOUNTER — Encounter: Payer: Self-pay | Admitting: Family Medicine

## 2022-04-28 VITALS — BP 138/70 | HR 71 | Temp 99.8°F | Ht 71.0 in | Wt 144.4 lb

## 2022-04-28 DIAGNOSIS — B354 Tinea corporis: Secondary | ICD-10-CM | POA: Diagnosis not present

## 2022-04-28 DIAGNOSIS — K219 Gastro-esophageal reflux disease without esophagitis: Secondary | ICD-10-CM

## 2022-04-28 NOTE — Patient Instructions (Addendum)
Flu shot- we should have these available within a month or two but please let us know if you get at outside pharmacy  Reflux- continue omeprazole 40 mg at least 1 month (if not 2 at higher dose)  then could try 20 mg if symptoms completely resolved on the 40mg - then after another month try pepcid then stop -continue to avoid triggers  Continue clotrimazole usually want to do at least a week after resolved- so could be 3-4 weeks of treatment- could consider oral treatment but want to hold off for now. If more redness, swelling lets check back in   Let know if you develop calf pain increase or swelling in the lower leg or other new symptoms  -options -[facility]: [phone]  [phone] counseling [email or web address]  at  [phone]. I think you would like Dr. [phone] in particular -[facility] [email or web address] at [phone]  Recommended follow up: Return for next already scheduled visit or sooner if needed.

## 2022-04-28 NOTE — Progress Notes (Signed)
Phone [phone] In person visit   Subjective:   Albert Edwards is a 26 y.o. year old very pleasant male patient who presents for/with See problem oriented charting Chief Complaint  Patient presents with   fungal infection under arm    Pt c/o fungal infection that came up Sunday and he has been using otc antifungals with no clearing.   Leg Pain    Pt c/o tense feeling in right leg/calf, he denies swelling and pain is 1/10.   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     fexofenadine (ALLEGRA) 180 MG tablet Take 180 mg by mouth as needed.     hyoscyamine (LEVSIN) 0.125 MG tablet Take 1 tablet (0.125 mg total) by mouth every 4 (four) hours as needed (for abdominal cramping. 4 doses a day maximum.). 90 tablet 5   ocrelizumab (OCREVUS) 300 MG/10ML injection Inject into the vein every 6 (six) months. Initial dose: 300mg  IV on day 1; repeat on day 15 Maintenance dose: 600mg  IV q 6 months     omeprazole (PRILOSEC) 40 MG capsule Take 1 capsule (40 mg total) by mouth daily. 30 capsule 3   vitamin B-12 (CYANOCOBALAMIN) 1000 MCG tablet Take 1 tablet (1,000 mcg total) by mouth daily. 90 tablet 3   Vitamin D3 (VITAMIN D) 25 MCG tablet Take 1 tablet (1,000 Units total) by mouth daily. 90 tablet 3   No current facility-administered medications for this visit.     Objective:  BP 138/70    Pulse 71   Temp 99.8 F (37.7 C)   Ht 5\' 11"  (1.803 m)   Wt 144 lb 6.4 oz (65.5 kg)   SpO2 98%   BMI 20.14 kg/m  Gen: NAD, resting comfortably CV: RRR no murmurs rubs or gallops Lungs: CTAB no crackles, wheeze, rhonchi.  Ext: no edema, no calf pain- unable to reproduce pain Skin: warm, dry, erythematous rash in right axilla  with slightly raised border but somewhat linear- there is some crusted/dried clotrimazole throughout his hairs. Smaller but similar rash in left axilla    Assessment and Plan   # GERD S:Medication: omeprazole 40 mg In last few days has felt very good/almost none- about 10 days. Usually burning sensation - rarely sharp - avoiding trigger foods -burping still  A/P: much improved in last few days- could simply be GERD but has chance of small ulceration so we discussed 1-2 months of treatment and then slowly weaning off- see avs. Also he is working to avoid triggers   #Rash S: [place] and then Sunday noted a rash in both axilla- right worse than left. Started antifungal OTC- clotrimazole twice a day (switched yesterday from a different one) has stabilized it but not improving. Has had similar in the past that  responded to topical OTC antifungal. No recent change in deodorants or soaps. . ROS-not ill appearing, no fever/chills. No new medications (omeprazole). Is immunocompromised. No mucus membrane involvement.  A/P: possible fungal rash (will label as tinea corporis) in bilateral axilla- right> left. Stabilized with clotrimazole- discussed possible oral but with stabilization want him to stay with current treatment for now- try to keep area relatively dry given started/promoted by moisture.  -doubt drug rash given timing related to being in [place] and similar issues in the past - he is immunocompromised which makes this higher risk but I see now sound of bacterial infection - we will monitor  closely  #Right leg pain S: Patient notes a tense sensation in his right leg/calf or in the shin/ankle  Rates pain 1 out of 10.  Sometimes a tingling sensation. No swelling.slightly better today A/P: his main concern was DVT. Wells criteria of 0. ? Related to MS but do not strongly suspect flare.   Recommended follow up: Return for next already scheduled visit or sooner if needed. Future Appointments  Date Time Provider Department Center  09/22/2022  8:00 AM Shelva Majestic, MD [facility]  10/04/2022  8:30 AM Sater, Pearletha Furl, MD [facility] None    Lab/Order associations:   ICD-10-CM   1. Gastroesophageal reflux disease without esophagitis  K21.9     2. Tinea corporis  B35.4      Return precautions advised.  [name], MD

## 2022-05-03 ENCOUNTER — Ambulatory Visit: Payer: BC Managed Care – PPO | Admitting: Family Medicine

## 2022-05-23 ENCOUNTER — Telehealth (INDEPENDENT_AMBULATORY_CARE_PROVIDER_SITE_OTHER): Payer: BC Managed Care – PPO | Admitting: Family Medicine

## 2022-05-23 ENCOUNTER — Encounter: Payer: Self-pay | Admitting: Family Medicine

## 2022-05-23 VITALS — Ht 71.0 in | Wt 140.0 lb

## 2022-05-23 DIAGNOSIS — K219 Gastro-esophageal reflux disease without esophagitis: Secondary | ICD-10-CM | POA: Diagnosis not present

## 2022-05-23 DIAGNOSIS — K529 Noninfective gastroenteritis and colitis, unspecified: Secondary | ICD-10-CM

## 2022-05-23 NOTE — Progress Notes (Signed)
Phone [phone] Virtual visit via Video note   Subjective:  Chief complaint: Chief Complaint  Patient presents with   Abdominal Pain    Pt c/o lingering diarrhea and stomach cramping that has been going on since labor day and he has had some chills and loss of appetite. Pt has had some nausea but that has subsdied and denies vomiting, pt has pulled out the hyoscyamine but not sure if this helped.    This visit type was conducted due to national recommendations for restrictions regarding the COVID-19 Pandemic (e.g. social distancing).  This format is felt to be most appropriate for this patient at this time balancing risks to patient and risks to population by having him in for in person visit.  No physical exam was performed (except for noted visual exam or audio findings with Telehealth visits).    Our team/I connected with Ernestine Conrad Schwinn at 11:00 AM EDT by a video enabled telemedicine application (doxy.me or caregility through epic) and verified that I am speaking with the correct person using two identifiers.  Location patient: [place] Location provider: [facility], office Persons participating in the virtual visit:  patient  Our team/I discussed the limitations of evaluation and management by telemedicine and the availability of in person appointments. In light of current covid-19 pandemic, patient also understands that we are trying to protect them by minimizing in office contact if at all possible.  The patient expressed consent for telemedicine visit and agreed to proceed. Patient understands insurance will be billed.   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     fexofenadine (ALLEGRA) 180 MG tablet Take 180 mg by mouth as needed.     hyoscyamine (LEVSIN) 0.125 MG tablet Take 1 tablet (0.125 mg total) by mouth every 4 (four) hours as needed (for abdominal cramping. 4 doses a day maximum.). 90 tablet 5   ocrelizumab (OCREVUS) 300 MG/10ML injection Inject into the vein every 6 (six) months. Initial dose: 300mg  IV on day 1; repeat on day 15 Maintenance dose: 600mg  IV q 6 months     omeprazole (PRILOSEC) 40 MG capsule Take 1 capsule (40 mg total) by mouth daily. 30 capsule 3   vitamin B-12 (CYANOCOBALAMIN) 1000 MCG tablet Take 1 tablet (1,000 mcg total) by mouth daily. 90 tablet 3   Vitamin D3 (VITAMIN D) 25 MCG tablet Take 1 tablet (1,000 Units total) by mouth daily. 90 tablet 3   No current facility-administered medications for this visit.     Objective:  Ht 5\' 11"  (1.803 m)   Wt 140 lb (63.5 kg)   BMI 19.53 kg/m  self reported vitals Gen: NAD, resting comfortably Lungs: nonlabored, normal respiratory rate  Skin: appears dry, no obvious rash     Assessment and Plan   #Diarrhea/abdominal cramping S: Patient started with issues around Labor Day (  started with diarrhea)-lingering diarrhea and stomach cramping.  Also had associated chills and loss of appetite.  Nausea seems to be improving and has not had vomiting.  Has tried hyoscyamine without significant benefit for cramping. Has taken loperamide as well mainly to get through work-seems to help some. Usually wakes up in AM with it then improves- occasionally has solid stools. Has tried bland diet.   In last 24 hours has had 2 episodes of diarrhea- 3 max. Frequency As been improving- at first day or two  was 6-7 BM per day. No blood in stool. Yellow stool- possible mucus but not sure. No melena. He is pushing lots of fluids and staying hydrated.  A/P: Appears patient developed viral gastroenteritis most likely-no red flags other than patient being immunosuppressed.  Symptoms are improving but likely prolonged due to immuno suppressed state-discussed if fails to continue to improve in the next 2 days or so we could collect stool for antigen testing/GI pathogen panel.  Okay to use Imodium to help him get through her workday  # GERD S:Medication: Omeprazole 40 mg started April 19, 2022 with plan for 1 to 56-month of treatment-last visit was seen significant improvement in symptoms-in case there was an ulceration we opted for up to 2 months. Occasional mild twinge of pain but overall much improved and not worsening A/P: Much improved-very rare mild breakthrough-continue current medication for at least 2 weeks past GI illness and then can try to reduce to 20 mg dose over-the-counter - Since he does not fit typical phenotype for reflux we discussed if we are unable to wean him back down considering GI consult    #Rash in axilla- cleared after several weeks but monitoring. [place]  Recommended follow up: Return for as needed for new, worsening, persistent symptoms. Future Appointments  Date Time Provider Department Center  05/23/2022 11:00 AM Shelva Majestic, MD [facility]  09/22/2022  8:00 AM Shelva Majestic, MD [facility]  10/04/2022  8:30 AM Sater, Pearletha Furl, MD [facility] None    Lab/Order associations:   ICD-10-CM   1. Gastroenteritis  K52.9     2. Gastroesophageal reflux disease without esophagitis  K21.9       No orders of the defined types were placed in this encounter.   Return precautions advised.  [name], MD

## 2022-05-23 NOTE — Patient Instructions (Addendum)
There are no preventive care reminders to display for this patient. ° °Recommended follow up: Return for as needed for new, worsening, persistent symptoms. °

## 2022-07-04 ENCOUNTER — Encounter: Payer: Self-pay | Admitting: Neurology

## 2022-07-04 ENCOUNTER — Other Ambulatory Visit: Payer: Self-pay | Admitting: Neurology

## 2022-07-04 MED ORDER — VALACYCLOVIR HCL 1 G PO TABS: ORAL_TABLET | ORAL | 0 refills | Status: DC

## 2022-07-19 ENCOUNTER — Other Ambulatory Visit: Payer: Self-pay | Admitting: Family Medicine

## 2022-08-31 ENCOUNTER — Telehealth: Payer: BC Managed Care – PPO | Admitting: Physician Assistant

## 2022-08-31 DIAGNOSIS — H66001 Acute suppurative otitis media without spontaneous rupture of ear drum, right ear: Secondary | ICD-10-CM | POA: Diagnosis not present

## 2022-09-01 MED ORDER — AMOXICILLIN 875 MG PO TABS: 875.0000 mg | ORAL_TABLET | Freq: Two times a day (BID) | ORAL | 0 refills | Status: AC

## 2022-09-01 NOTE — Progress Notes (Signed)

## 2022-09-22 ENCOUNTER — Ambulatory Visit (INDEPENDENT_AMBULATORY_CARE_PROVIDER_SITE_OTHER)
Admission: RE | Admit: 2022-09-22 | Discharge: 2022-09-22 | Disposition: A | Discharge status: Home / Self Care | Payer: 59 | Source: Ambulatory Visit | Attending: Family Medicine | Admitting: Family Medicine

## 2022-09-22 ENCOUNTER — Ambulatory Visit (INDEPENDENT_AMBULATORY_CARE_PROVIDER_SITE_OTHER): Payer: 59 | Admitting: Family Medicine

## 2022-09-22 ENCOUNTER — Encounter: Payer: Self-pay | Admitting: Family Medicine

## 2022-09-22 VITALS — BP 128/70 | HR 76 | Temp 98.4°F | Ht 71.0 in | Wt 150.6 lb

## 2022-09-22 DIAGNOSIS — Z0000 Encounter for general adult medical examination without abnormal findings: Secondary | ICD-10-CM | POA: Diagnosis not present

## 2022-09-22 DIAGNOSIS — G35 Multiple sclerosis: Secondary | ICD-10-CM | POA: Diagnosis not present

## 2022-09-22 DIAGNOSIS — R0789 Other chest pain: Secondary | ICD-10-CM

## 2022-09-22 DIAGNOSIS — Z8349 Family history of other endocrine, nutritional and metabolic diseases: Secondary | ICD-10-CM

## 2022-09-22 DIAGNOSIS — E559 Vitamin D deficiency, unspecified: Secondary | ICD-10-CM

## 2022-09-22 LAB — CBC WITH DIFFERENTIAL/PLATELET
Basophils Absolute: 0 10*3/uL (ref 0.0–0.1)
Basophils Relative: 0.9 % (ref 0.0–3.0)
Eosinophils Absolute: 0.1 10*3/uL (ref 0.0–0.7)
Eosinophils Relative: 5.1 % — ABNORMAL HIGH (ref 0.0–5.0)
HCT: 47.7 % (ref 39.0–52.0)
Hemoglobin: 16.4 g/dL (ref 13.0–17.0)
Lymphocytes Relative: 28.3 % (ref 12.0–46.0)
Lymphs Abs: 0.8 10*3/uL (ref 0.7–4.0)
MCHC: 34.3 g/dL (ref 30.0–36.0)
MCV: 85.8 fl (ref 78.0–100.0)
Monocytes Absolute: 0.6 10*3/uL (ref 0.1–1.0)
Monocytes Relative: 21 % — ABNORMAL HIGH (ref 3.0–12.0)
Neutro Abs: 1.2 10*3/uL — ABNORMAL LOW (ref 1.4–7.7)
Neutrophils Relative %: 44.7 % (ref 43.0–77.0)
Platelets: 190 10*3/uL (ref 150.0–400.0)
RBC: 5.56 Mil/uL (ref 4.22–5.81)
RDW: 13.6 % (ref 11.5–15.5)
WBC: 2.7 10*3/uL — ABNORMAL LOW (ref 4.0–10.5)

## 2022-09-22 LAB — COMPREHENSIVE METABOLIC PANEL
ALT: 21 U/L (ref 0–53)
AST: 17 U/L (ref 0–37)
Albumin: 5 g/dL (ref 3.5–5.2)
Alkaline Phosphatase: 66 U/L (ref 39–117)
BUN: 13 mg/dL (ref 6–23)
CO2: 27 mEq/L (ref 19–32)
Calcium: 9.6 mg/dL (ref 8.4–10.5)
Chloride: 104 mEq/L (ref 96–112)
Creatinine, Ser: 0.93 mg/dL (ref 0.40–1.50)
GFR: 114.68 mL/min (ref >60.00)
Glucose, Bld: 92 mg/dL (ref 70–99)
Potassium: 4.1 mEq/L (ref 3.5–5.1)
Sodium: 140 mEq/L (ref 135–145)
Total Bilirubin: 2.4 mg/dL — ABNORMAL HIGH (ref 0.2–1.2)
Total Protein: 7.1 g/dL (ref 6.0–8.3)

## 2022-09-22 LAB — VITAMIN B12: Vitamin B-12: 954 pg/mL — ABNORMAL HIGH (ref 211–911)

## 2022-09-22 LAB — VITAMIN D 25 HYDROXY (VIT D DEFICIENCY, FRACTURES): VITD: 34.24 ng/mL (ref 30.00–100.00)

## 2022-09-22 MED ORDER — TRIAMCINOLONE ACETONIDE 0.1 % EX CREA: 1.0000 | TOPICAL_CREAM | Freq: Two times a day (BID) | CUTANEOUS | 0 refills | Status: AC

## 2022-09-22 NOTE — Progress Notes (Addendum)
Phone: [phone]    Subjective:  Patient presents today for their annual physical. Chief complaint-noted.   See problem oriented charting- ROS- full  review of systems was completed and negative  except for: immunocompromised, epigastric pain but can radiate to left side (has noted veins there- not worse with exercise), rash on hands  The following were reviewed and entered/updated in epic: Past Medical History:  Diagnosis Date   IBS (irritable colon syndrome)    Constipation predominant. Constipation- with BM abdominal pain improves. Comes in waves with higher stress. worse with less exercise. 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    Priority: High   Multiple sclerosis, relapsing-remitting ([place]) 05/29/2020    Priority: High   Vitamin D deficiency 09/21/2021    Priority: Medium    IBS (irritable colon syndrome)     Priority: Medium    Right leg pain 07/02/2014    Priority: Low   Urinary frequency 03/16/2021   High risk medication use 09/15/2020   Past Surgical History:  Procedure Laterality Date   ADENOIDECTOMY     2006   TONSILLECTOMY      Family History  Problem Relation Age of Onset   Healthy Mother        and father   Hyperlipidemia Other        grandparent   Hypertension Other        grandparent   Diabetes Other        grandparent   Hypothyroidism Other    ADD / ADHD Brother    Other Father        Vit B12 def    Medications- reviewed and updated Current Outpatient Medications  Medication Sig Dispense Refill   acetaminophen (TYLENOL) 500 MG tablet Take 500 mg by mouth every 6 (six) hours as needed for moderate pain.     fexofenadine (ALLEGRA) 180 MG tablet Take 180 mg by mouth as needed.     ocrelizumab (OCREVUS) 300 MG/10ML injection Inject into the vein every 6 (six) months. Initial dose:  300mg  IV on day 1; repeat on day 15 Maintenance dose: 600mg  IV q 6 months     triamcinolone cream (KENALOG) 0.1 % Apply 1 Application topically 2 (two) times daily. For 7-10 days maximum. Cover with vaseline or eucerin after use. 80 g 0   vitamin B-12 (CYANOCOBALAMIN) 1000 MCG tablet Take 1 tablet (1,000 mcg total) by mouth daily. 90 tablet 3   Vitamin D3 (VITAMIN D) 25 MCG tablet Take 1 tablet (1,000 Units total) by mouth daily. 90 tablet 3   hyoscyamine (LEVSIN) 0.125 MG tablet Take 1 tablet (0.125 mg total) by mouth every 4 (four) hours as needed (for abdominal cramping. 4 doses a day maximum.). (Patient not taking: Reported on 09/22/2022) 90 tablet 5   omeprazole (PRILOSEC) 40 MG capsule TAKE 1 CAPSULE BY MOUTH DAILY (Patient not taking: Reported on 09/22/2022) 90 capsule 3   No current facility-administered medications for this visit.    Allergies-reviewed and updated No Known Allergies  Social History   Social History Narrative   Family: Marred. Son born November 20 2021. 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    [place]- for Emergency planning/management officer [phone]  HS- [place]. High achieving student      Hobbies: exercise, video games on xbox and PC, switch, learning to play guitar      Prior PCP: Nena Polio, FNP [facility] in [place]      Objective:  BP 128/70   Pulse 76   Temp 98.4 F (36.9 C)   Ht 5\' 11"  (1.803 m)   Wt 150 lb 9.6 oz (68.3 kg)   SpO2 99%   BMI 21.00 kg/m  Gen: NAD, resting comfortably HEENT: Mucous membranes are moist. Oropharynx normal Neck: no thyromegaly CV: RRR no murmurs rubs or gallops Lungs: CTAB no crackles, wheeze, rhonchi Abdomen: soft/nontender/nondistended/normal bowel sounds. No rebound or guarding.  Ext: no edema Skin: warm, dry Neuro: grossly normal, moves all extremities, PERRLA    Assessment and Plan:  27 y.o. male  presenting for annual physical.  Health Maintenance counseling: 1. Anticipatory guidance: Patient counseled regarding regular dental exams -q6 months w/ dad, eye exams- needs to update was 2 years,  avoiding smoking and second hand smoke , limiting alcohol to 2 beverages per day- rare social, no illicit drugs.   2. Risk factor reduction:  Advised patient of need for regular exercise and diet rich and fruits and vegetables to reduce risk of heart attack and stroke.  Exercise- 1-2 days a week at gym plus daily walking but harder in winter. Wants to bump to 2-3 days a week at gym Diet/weight management-reasonably healthy.  Wt Readings from Last 3 Encounters:  09/22/22 150 lb 9.6 oz (68.3 kg)  05/23/22 140 lb (63.5 kg)  04/28/22 144 lb 6.4 oz (65.5 kg)  3. Immunizations/screenings/ancillary studies- defer hep c, covid- holding off after discussion with Dr. Felecia Shelling  Immunization History  Administered Date(s) Administered   DTaP 01/29/1998, 04/09/1998, 05/27/1998, 02/28/1999, 03/18/2002   HIB (PRP-T) 01/29/1998, 04/09/1998, 05/27/1998, 02/28/1999   HPV Quadrivalent 03/17/2011, 04/08/2013, 04/16/2014   Hepatitis A, Ped/Adol-2 Dose 03/17/2011, 04/08/2013   Hepatitis B 1998/05/02, 12/28/1997, 10/19/1998   IPV 01/29/1998, 04/09/1998, 05/27/1998, 03/18/2002   Influenza,inj,Quad PF,6+ Mos 05/23/2019, 06/25/2021   Influenza-Unspecified 08/03/2004, 07/12/2007, 07/05/2012, 06/07/2013, 06/06/2014, 04/26/2015, 06/27/2015, 05/12/2017, 06/17/2020   Janssen (J&J) SARS-COV-2 Vaccination 12/18/2019   MMR 12/06/1998, 03/18/2002   Meningococcal B, OMV 11/11/2015   Meningococcal Conjugate 01/07/2009, 04/16/2014, 12/13/2015   PFIZER(Purple Top)SARS-COV-2 Vaccination 06/03/2020, 06/24/2020   PNEUMOCOCCAL CONJUGATE-20 09/21/2021   Pfizer Covid-19 Vaccine Bivalent Booster 32yrs & up 01/11/2021   Td 01/07/2009   Tdap 01/07/2009, 06/06/2019   Varicella 12/06/1998, 04/08/2013  4. Prostate cancer screening-  no family  history, start at age 70  5. Colon cancer screening -  no family history, start at age 29 6. Skin cancer screening/prevention-monitoring back 8 x 6 mm lesion stable since 2020 reddish (but with other issues we did not look today- check next year) - does not see dermatologist (could refer if hands dont improve). advised regular sunscreen use. Denies worrisome, changing, or new skin lesions.  7. Testicular cancer screening- advised monthly self exams. No change since 2018 u/s epidymal cysts 8. STD screening- patient opts  out- only active with wife 67. Smoking associated screening- never smoker  Status of chronic or acute concerns   #Multiple sclerosis S: Medication: On Ocrevus injections-no exacerbations  that we are aware of  (? With the chest issues but dont suspect true exacerbations) or new neurological symptoms since starting thankfully.  Sees Dr. Felecia Shelling regularly. Imaging in July stable A/P: apperas stable- continue close follow up with neurology    #Vitamin D deficiency S:  Medication: 1000 units daily Last vitamin D Lab Results  Component Value Date   VD25OH 34.1 09/21/2021  A/P: hopefully stable- update vitamin D  today. Continue current meds for now  # GERD? With ongoing intermittent Atypical chest pain S:Medication: omeprazole 40 mg improved symptoms in the past with complete resolution but we tried to wean off and he had recurrence- restarted meds about a month ago and took 2 weeks and not sure if improved symptoms but didn't worsen. Not currently on it- but when he came back off of it also didn't worsen. No tingling in extremity. No shortness of breath with it even with spin bike with good intensity. Was able to carry son on shoulders for over 2 mile hike without pain and no shortness of breath  -hard to pinpoint triggers - mild pain 2-3/10 when occurs. More of a dull pain -no recent scuba diving- but had been before beginning of this -h pylori was negative in August when issues  started -EKG reassuring -occasional red bumps on chest that dont itch or burn and go away quickly- doubt related.  A/P: discussed feel less strongly that this is GERD at this point. Discussed possible MS hug.   -have not done CXR yet so we will obtain -considered CT chest including angiogram but doubt significant vascular issue particularly pulmonary embolism - considered cardiac workup- EKG reassuring in past- no pain with exercise - so strongly doubt - hold off on restarted omeprazole for now- would like to get Dr. Bonnita Hollow opinion later this month on possible MS hug- when I described this he said -- best explanation he had read - if symptoms worsen or Dr. Epimenio Foot does not think related- reconsider imaging or cardiac workup  -seeing therapist to help with stress of illness with MS- avoiding dwelling   #IBS -reported good first visit here November 11, 2015- had reassuring CT scan in the past-I do not see that we have obtained records.  Worsened with periods of high stress.  Symptoms resolved after bowel movement typically.  Taking Hyocyamine 0.125 mg as needed- can constipate him though- has not needed recently  -still comes and goes but better overall   #Canker sores- fluocinonide from his dentist- somewhat was helpful- has had one bad one with illness in 2023. No cold sores/fever blisters in years.     #Family history of low B12-patient takes B12 empirically.   #Eczema- dry skin on hands that can even bleed- mainly on dorsal side of fingers. Does work outside a lot. Has used vaseline everynight with gloves on top.  -we will layer in steroid cream triamcinolone twice a day and cover with vaseline or eucerin for 7-10 days. Then go down to vaseline or eucerin twice a day alone. I think this will improve but let us know if not   Recommended follow up: Return in about 1 year (around 09/23/2023) for physical or sooner if needed.Schedule b4 you leave. Future Appointments  Date Time Provider Department  Center  10/04/2022  8:30 AM Sater, Pearletha Furl, MD [facility] None   Lab/Order associations: fasting   ICD-10-CM   1. Preventative health care  Z00.00 Vitamin D (25 hydroxy)    CBC with Differential/Platelet    Comprehensive metabolic panel    Vitamin B12    2. Vitamin D deficiency  E55.9 Vitamin D (25 hydroxy)    3. Multiple sclerosis, relapsing-remitting (HCC)  G35 CBC with Differential/Platelet    Comprehensive metabolic panel    IgG, IgA, IgM  4. Intermittent left-sided chest pain  R07.89 DG Chest 2 View    5. Family history of B12 deficiency  Z83.49 Vitamin B12      Meds ordered this encounter  Medications   triamcinolone cream (KENALOG) 0.1 %    Sig: Apply 1 Application topically 2 (two) times daily. For 7-10 days maximum. Cover with vaseline or eucerin after use.    Dispense:  80 g    Refill:  0    Return precautions advised.   [name], MD

## 2022-09-22 NOTE — Patient Instructions (Addendum)
Please go to [facility]  - located [address] across the street from [place] - in the basement - Hours: 8:30-5:00 PM M-F (with lunch from 12:30- 1 PM). You do NOT need an appointment.    Please stop by lab before you go If you have mychart- we will send your results within 3 business days of [place] receiving them.  If you do not have mychart- we will call you about results within 5 business days of [place] receiving them.  *please also note that you will see labs on mychart as soon as they post. I will later go in and write notes on them- will say "notes from Dr. Yong Channel"   - if symptoms worsen  (or new symptoms particularly shortness of breath ) or Dr. Felecia Shelling does not think related to MS- reconsider imaging or cardiac workup   -we will layer in steroid cream triamcinolone twice a day and cover with vaseline or eucerin for 7-10 days. Then go down to vaseline or eucerin twice a day alone. I think this will improve but let us know if not - can refer to derm if needed closer to home  Recommended follow up: Return in about 1 year (around 09/23/2023) for physical or sooner if needed.Schedule b4 you leave.

## 2022-09-23 LAB — IGG, IGA, IGM
IgG (Immunoglobin G), Serum: 818 mg/dL (ref 600–1640)
IgM, Serum: 43 mg/dL — ABNORMAL LOW (ref 50–300)
Immunoglobulin A: 133 mg/dL (ref 47–310)

## 2022-09-26 ENCOUNTER — Encounter: Payer: Self-pay | Admitting: Neurology

## 2022-10-04 ENCOUNTER — Other Ambulatory Visit: Payer: Self-pay | Admitting: Neurology

## 2022-10-04 ENCOUNTER — Encounter: Payer: Self-pay | Admitting: Neurology

## 2022-10-04 ENCOUNTER — Ambulatory Visit (INDEPENDENT_AMBULATORY_CARE_PROVIDER_SITE_OTHER): Payer: Commercial Managed Care - PPO | Admitting: Neurology

## 2022-10-04 VITALS — BP 138/81 | HR 87 | Ht 71.0 in | Wt 151.6 lb

## 2022-10-04 DIAGNOSIS — R208 Other disturbances of skin sensation: Secondary | ICD-10-CM | POA: Diagnosis not present

## 2022-10-04 DIAGNOSIS — H469 Unspecified optic neuritis: Secondary | ICD-10-CM

## 2022-10-04 DIAGNOSIS — G35 Multiple sclerosis: Secondary | ICD-10-CM | POA: Diagnosis not present

## 2022-10-04 DIAGNOSIS — R350 Frequency of micturition: Secondary | ICD-10-CM | POA: Diagnosis not present

## 2022-10-04 DIAGNOSIS — Z79899 Other long term (current) drug therapy: Secondary | ICD-10-CM

## 2022-10-04 DIAGNOSIS — R079 Chest pain, unspecified: Secondary | ICD-10-CM

## 2022-10-04 NOTE — Progress Notes (Signed)
[facility]  PATIENT: Albert Edwards DOB: 01-09-1996  REFERRING DOCTOR OR PCP:  [name] / [name] SOURCE: Patient, notes from hospitalization and PCP, imaging laboratory reports, MRI images personally reviewed and interpreted.  _________________________________   HISTORICAL  CHIEF COMPLAINT:  Chief Complaint  Patient presents with   Follow-up    RM 10, alone. MS DMT: Ocrevus. Typically gets scratchy throat during infusion but benadryl helps. Resolves same day.     HISTORY OF PRESENT ILLNESS:  Albert Edwards is a 27 y.o. man with relapsing remitting multiple sclerosis.  Update 10/04/2022: He started Ocrevus November 2021 and had his last infusion December 2023.     He has not had any exacerbaions or new neurologic symptoms.    He had moderate infusion reactions first dose but nothing significant last time.   He has had some heartburn and takes Prilosec,     He is randomly getting chest discomfort left > right, sometimes upper mid abdomen, not associated with exertion or exercise.  Feels burning at 2/10 intensity.  Not much tightness. He noticed it more last week during a stressful business trip    EKG and CXR were reportedly normal.   He has not had an Echo He is concerned about 'MS hug'  Symptoms started after scuba diving (30 minutes at 30 feet - was forst concerned about decompression).      Gait and balance are doing well.   He runs fine.   No stumbles or falls.   He often  holds the bannister, especially if he holds the  baby on the stairs.    He walks a few mile daily. Strength and sensation are doing well.   He has not had a recent Lhermitte sign.   Blurry vision improved but still bothers him at time,often when taking a shower on the right eye only.   He has mild urinary frequency but no incontinence.   He has nocturia x 1-2.     He does not have too much fatigue most days.Marland Kitchen   He sleeps well.   He exercises but less than last  visit.Marland Kitchen  He does everything he needs to and wants to do.   Mood is fine.   He has word finding difficulty but not enough to cause issues.     He did an Engineer, building services at [employer].  He is working at [employer] in [place].     He has a son born March 2023.  He takes Vit D and B12 daily.       MS Histroy:  He had the onset of vertigo in August 2021 and improved some with meclizine.   Shortly thereafter, he noted blurry vision while running in early September.  The visual symptoms persisted he had an MRI of the brain performed 05/28/2020 which showed multiple foci c/w MS, some that enhanced.    He had noted minimal balance issues off/on over the last year but was going up and down stairs without issues and carried on all his normal activities.   He had no numbness, tingling or weakness.   He had no bladder vision.    After the abnormal brain MRI, he was admitted to [facility] 05/29/19 and received 3 days of IV Solu-Medrol.  Additionally, he had MRI of the cervical thoracic spine showing additional lesions including 1 enhancing lesion to the right adjacent to C6.  He tolerated the steroid well and felt some improvement of symptoms.  He has not  had any new symptoms since that time.  I saw him shortly after discharge and treatment options were discussed.  He opted to begin Ocrevus.  His first dose was October 2021  No FH of MS,   His father had B12 deficiency and needed treatment.    He is otherwise in good health.      MRI images/Labs MRI brain 05/28/2020 shows multiple T2/FLAIR hyperintense foci.  Foci are present in the left middle cerebellar peduncle, cerebellar vermis, and in the periventricular, juxtacortical deep white matter of the hemispheres.  Several foci enhance after contrast.  The largest is in the posterior left frontal lobe.  There are also a few punctate enhancing foci in the hemispheres including a periventricular enhancing focus on the right and a juxtacortical focus in the left parietal  lobe.  MRI of the cervical spine 05/30/2020 showed an enhancing focus adjacent to C6 another T2 hyperintense foci that are chronic.  These are located anteriorly adjacent to C3-C4, posterior laterally to the right adjacent to C4, posterior laterally to the left adjacent to C5, anteriorly adjacent to C5, laterally to the right adjacent to C6 (enhancing consistent with acute focus), posterior laterally to the left adjacent to C7, centrally adjacent to T1,  MRI of the thoracic spine 05/30/2020 shows several T2 hyperintense foci consistent with chronic demyelinating plaque.  No enhancing lesions.   These are located centrally adjacent to T1, posterolaterally to the left adjacent to T9,  MRI of the brain 02/26/2021 shows T2/flair hyperintense lesions in the left middle cerebellar peduncle and in the periventricular, juxtacortical and deep white matter of both hemispheres.  None of the foci enhance.  Compared to the MRI from 05/28/2020, there do not appear to be any new lesions.  The foci that enhanced in 2021 no longer do so  MRI of the cervical spine 02/26/2021 shows T2 hyperintense foci anteriorly at C3-C4, posterolaterally to the right at C4, posterolaterally to the left at C4 C5-C5, to the right at C5, to the right and the left at C5-C6, posterolaterally to the left at C6-C7 and possibly a focus to the right at that level, anterocentrally at T1.  None of the foci enhance.  Compared to the MRI from 05/30/2020 and compared side-by-side, there were no new lesions.  The focus that enhanced in 2021 no longer does  MRI of the brain and cervical spine 03/22/2022 showed no new lesions.   Laboratory tests  05/28/2020 through 05/30/2020 show negative PCR test for SARS-CoV-2.  HIV negative.  SSA/SSB negative, Vitamin D was 38.   REVIEW OF SYSTEMS: Constitutional: No fevers, chills, sweats, or change in appetite Eyes: No visual changes, double vision, eye pain Ear, nose and throat: No hearing loss, ear pain, nasal  congestion, sore throat Cardiovascular: No chest pain, palpitations Respiratory:  No shortness of breath at rest or with exertion.   No wheezes GastrointestinaI: No nausea, vomiting, diarrhea, abdominal pain, fecal incontinence Genitourinary:  No dysuria, urinary retention or frequency.  No nocturia. Musculoskeletal:  No neck pain, back pain Integumentary: No rash, pruritus, skin lesions Neurological: as above Psychiatric: No depression at this time.  No anxiety Endocrine: No palpitations, diaphoresis, change in appetite, change in weigh or increased thirst Hematologic/Lymphatic:  No anemia, purpura, petechiae. Allergic/Immunologic: No itchy/runny eyes, nasal congestion, recent allergic reactions, rashes  ALLERGIES: No Known Allergies  HOME MEDICATIONS:  Current Outpatient Medications:    acetaminophen (TYLENOL) 500 MG tablet, Take 500 mg by mouth every 6 (six) hours as needed for  moderate pain., Disp: , Rfl:    fexofenadine (ALLEGRA) 180 MG tablet, Take 180 mg by mouth as needed., Disp: , Rfl:    hyoscyamine (LEVSIN) 0.125 MG tablet, Take 1 tablet (0.125 mg total) by mouth every 4 (four) hours as needed (for abdominal cramping. 4 doses a day maximum.)., Disp: 90 tablet, Rfl: 5   ocrelizumab (OCREVUS) 300 MG/10ML injection, Inject into the vein every 6 (six) months. Initial dose: 300mg  IV on day 1; repeat on day 15 Maintenance dose: 600mg  IV q 6 months, Disp: , Rfl:    omeprazole (PRILOSEC) 40 MG capsule, TAKE 1 CAPSULE BY MOUTH DAILY, Disp: 90 capsule, Rfl: 3   triamcinolone cream (KENALOG) 0.1 %, Apply 1 Application topically 2 (two) times daily. For 7-10 days maximum. Cover with vaseline or eucerin after use., Disp: 80 g, Rfl: 0   vitamin B-12 (CYANOCOBALAMIN) 1000 MCG tablet, Take 1 tablet (1,000 mcg total) by mouth daily., Disp: 90 tablet, Rfl: 3   Vitamin D3 (VITAMIN D) 25 MCG tablet, Take 1 tablet (1,000 Units total) by mouth daily., Disp: 90 tablet, Rfl: 3  PAST MEDICAL  HISTORY: Past Medical History:  Diagnosis Date   IBS (irritable colon syndrome)    Constipation predominant. Constipation- with BM abdominal pain improves. Comes in waves with higher stress. worse with less exercise. Plan: increase exercise, watch FODMAP, consider metamucil if that doesnt work   Multiple sclerosis ([place])     PAST SURGICAL HISTORY: Past Surgical History:  Procedure Laterality Date   ADENOIDECTOMY     2006   TONSILLECTOMY      FAMILY HISTORY: Family History  Problem Relation Age of Onset   Healthy Mother        and father   Hyperlipidemia Other        grandparent   Hypertension Other        grandparent   Diabetes Other        grandparent   Hypothyroidism Other    ADD / ADHD Brother    Other Father        Vit B12 def    SOCIAL HISTORY:  Social History   Socioeconomic History   Marital status: Married    Spouse name: Not on file   Number of children: Not on file   Years of education: Not on file   Highest education level: Not on file  Occupational History   Not on file  Tobacco Use   Smoking status: Never   Smokeless tobacco: Never  Substance and Sexual Activity   Alcohol use: Yes    Alcohol/week: 0.0 standard drinks of alcohol    Comment: 1-2 per week   Drug use: No   Sexual activity: Not on file  Other Topics Concern   Not on file  Social History Narrative   Family: Marred. Son born November 20 2021. 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    [employer]- for Emergency planning/management officer 2020   [place]. High achieving student      Hobbies: exercise, video games on xbox and PC, switch, learning to play guitar      Prior PCP: Nena Polio, [place] in [place] Strain: Not on file  Food Insecurity: Not on file  Transportation Needs: Not on file  Physical Activity: Not on  file  Stress:  Not on file  Social Connections: Not on file  Intimate Partner Violence: Not on file     PHYSICAL EXAM  Vitals:   10/04/22 0805  BP: 138/81  Pulse: 87  Weight: 151 lb 9.6 oz (68.8 kg)  Height: 5\' 11"  (1.803 m)    Body mass index is 21.14 kg/m.   General: The patient is well-developed and well-nourished and in no acute distress  HEENT:  Head is [place]/AT.  Sclera are anicteric.    Skin: Extremities are without rash or  edema.  Neurologic Exam  Mental status: The patient is alert and oriented x 3 at the time of the examination. The patient has apparent normal recent and remote memory, with an apparently normal attention span and concentration ability.   Speech is normal.  Cranial nerves: Extraocular movements are full.  Color vision was symmetric.  Facial strength is normal.  Trapezius and sternocleidomastoid strength is normal. No dysarthria is noted.  No obvious hearing deficits noted.  Motor:  Muscle bulk is normal.   Tone is normal. Strength is  5 / 5 in all 4 extremities.   Sensory: Sensory testing is intact to pinprick, soft touch and vibration sensation in all 4 extremities.  Coordination: Cerebellar testing reveals good finger-nose-finger and heel-to-shin bilaterally.  Gait and station: Station is normal.   Gait is normal.  The tandem gait is minimally wide.  Romberg is negative.  Reflexes: Deep tendon reflexes are symmetric but increased, 3 in the arms and 3+ at the knees with spread and no clonus.     DIAGNOSTIC DATA (LABS, IMAGING, TESTING) - I reviewed patient records, labs, notes, testing and imaging myself where available.  Lab Results  Component Value Date   WBC 2.7 (L) 09/22/2022   HGB 16.4 09/22/2022   HCT 47.7 09/22/2022   MCV 85.8 09/22/2022   PLT 190.0 09/22/2022      Component Value Date/Time   NA 140 09/22/2022 0846   K 4.1 09/22/2022 0846   CL 104 09/22/2022 0846   CO2 27 09/22/2022 0846   GLUCOSE 92 09/22/2022 0846   BUN  13 09/22/2022 0846   CREATININE 0.93 09/22/2022 0846   CALCIUM 9.6 09/22/2022 0846   PROT 7.1 09/22/2022 0846   ALBUMIN 5.0 09/22/2022 0846   AST 17 09/22/2022 0846   ALT 21 09/22/2022 0846   ALKPHOS 66 09/22/2022 0846   BILITOT 2.4 (H) 09/22/2022 0846   GFRNONAA >60 05/30/2020 0546   GFRAA >60 05/30/2020 0546   Lab Results  Component Value Date   CHOL 114 06/06/2019   HDL 44.20 06/06/2019   LDLCALC 59 06/06/2019   TRIG 50.0 06/06/2019   CHOLHDL 3 06/06/2019   No results found for: "HGBA1C" Lab Results  Component Value Date   VITAMINB12 954 (H) 09/22/2022   No results found for: "TSH"     ASSESSMENT AND PLAN  Multiple sclerosis, relapsing-remitting (HCC)  Dysesthesia  High risk medication use  Urinary frequency  Right optic neuropathy  1.   Continue Ocrevus. Has recent CBC with differential, IgG/IgM MRI recently showed no new lesions since starting Ocrevus. 2.   Stay active and exercise as tolerated.   3.   If sensory issues worsen, add gabapentin or lamotrigine 4.   We will check Echo (he will call with a location in [place]).    5.   He will return to see me in 6 months or sooner for new or worsening neurologic symptoms.    Clifford Coudriet A. 11/21/2022, MD, [facility]  10/04/2022, 8:58 AM Certified in Neurology, Clinical Neurophysiology, Sleep Medicine and Neuroimaging  [facility] [address] [place], [place] [address] [phone]

## 2022-10-09 ENCOUNTER — Other Ambulatory Visit: Payer: Self-pay | Admitting: *Deleted

## 2022-10-09 ENCOUNTER — Telehealth: Payer: Self-pay | Admitting: Neurology

## 2022-10-09 DIAGNOSIS — G35 Multiple sclerosis: Secondary | ICD-10-CM

## 2022-10-09 DIAGNOSIS — Z79899 Other long term (current) drug therapy: Secondary | ICD-10-CM

## 2022-10-09 DIAGNOSIS — R079 Chest pain, unspecified: Secondary | ICD-10-CM

## 2022-10-09 NOTE — Telephone Encounter (Signed)
Referral for Cardiology fax tgo [facility]. Phone: [phone], Fax: [phone]

## 2022-10-10 ENCOUNTER — Encounter: Payer: Self-pay | Admitting: Neurology

## 2023-01-04 DIAGNOSIS — G35 Multiple sclerosis: Secondary | ICD-10-CM | POA: Insufficient documentation

## 2023-01-04 DIAGNOSIS — K589 Irritable bowel syndrome without diarrhea: Secondary | ICD-10-CM | POA: Insufficient documentation

## 2023-01-04 DIAGNOSIS — K219 Gastro-esophageal reflux disease without esophagitis: Secondary | ICD-10-CM | POA: Insufficient documentation

## 2023-01-21 ENCOUNTER — Encounter: Payer: Self-pay | Admitting: Neurology

## 2023-04-12 ENCOUNTER — Ambulatory Visit (INDEPENDENT_AMBULATORY_CARE_PROVIDER_SITE_OTHER): Payer: Commercial Managed Care - PPO | Admitting: Neurology

## 2023-04-12 ENCOUNTER — Encounter: Payer: Self-pay | Admitting: Neurology

## 2023-04-12 VITALS — BP 138/74 | HR 59 | Ht 71.0 in | Wt 150.0 lb

## 2023-04-12 DIAGNOSIS — R208 Other disturbances of skin sensation: Secondary | ICD-10-CM | POA: Diagnosis not present

## 2023-04-12 DIAGNOSIS — H469 Unspecified optic neuritis: Secondary | ICD-10-CM

## 2023-04-12 DIAGNOSIS — Z79899 Other long term (current) drug therapy: Secondary | ICD-10-CM

## 2023-04-12 DIAGNOSIS — G35 Multiple sclerosis: Secondary | ICD-10-CM | POA: Diagnosis not present

## 2023-04-12 DIAGNOSIS — G35A Relapsing-remitting multiple sclerosis: Secondary | ICD-10-CM

## 2023-04-12 DIAGNOSIS — K120 Recurrent oral aphthae: Secondary | ICD-10-CM

## 2023-04-12 MED ORDER — MODAFINIL 200 MG PO TABS: 200.0000 mg | ORAL_TABLET | Freq: Every day | ORAL | 1 refills | Status: DC

## 2023-04-12 MED ORDER — VALACYCLOVIR HCL 1 G PO TABS: ORAL_TABLET | ORAL | 1 refills | Status: DC

## 2023-04-12 NOTE — Progress Notes (Signed)
[facility]  PATIENT: Albert Edwards DOB: 1996/04/09  REFERRING DOCTOR OR PCP:  [name] / [name] SOURCE: Patient, notes from hospitalization and PCP, imaging laboratory reports, MRI images personally reviewed and interpreted.  _________________________________   HISTORICAL  CHIEF COMPLAINT:  Chief Complaint  Patient presents with   Room 10    Pt is here  Alone. Pt states that he has ulcers in his mouth that started 1 week ago. Pt states that he had some fever and chills that started last Friday, and he still has sinus pressure in his ears. Pt states that he still has blurry vision.     HISTORY OF PRESENT ILLNESS:  Albert Edwards is a 27 y.o. man with relapsing remitting multiple sclerosis.  Update 04/12/2023 He has several lesions in his mouth - he bit his lip and it swelled up    He has several other lesions in his mouth.     He is using a steroid cream.     He has had canker sores many years.     No other skin/mucosal membrance lesions  He had an infection with fevers/chills last week bu is fine now.   He had pre-syncoe one episode wit this event.     He started [facility] November 2021 and had his last infusion December 2023.     He has not had any exacerbaions or new neurologic symptoms.    He had moderate infusion reactions first dose but nothing significant last time.   He has had some heartburn and takes Prilosec,     He is randomly getting chest discomfort left > right, sometimes upper mid abdomen, not associated with exertion or exercise.  Feels burning at 2/10 intensity.  Not much tightness. He noticed it more last week during a stressful business trip    EKG and CXR were reportedly normal.   He has not had an Echo He is concerned about 'MS hug'  Symptoms started after scuba diving (30 minutes at 30 feet - was forst concerned about decompression).      Gait and balance are doing well.   He runs fine.   No stumbles or falls.   He  often  holds the bannister, especially if he holds the  baby on the stairs.    He walks a few mile daily. Strength and sensation are doing well.   He has not had a recent Lhermitte sign.   Blurry vision improved but still bothers him at time,often when taking a shower on the right eye only.   He has mild urinary frequency but no incontinence.   He has nocturia x 1-2.     He does not have too much fatigue most days.Marland Kitchen   He sleeps well.   He exercises but less than last visit.Marland Kitchen  He does everything he needs to and wants to do.   Mood is fine.   He has word finding difficulty but not enough to cause issues.     He did an Engineer, building services at [employer].  He is working at [employer] in [place].     He has a son born March 2023.  He takes Vit D and B12 daily.       MS Histroy:  He had the onset of vertigo in August 2021 and improved some with meclizine.   Shortly thereafter, he noted blurry vision while running in early September.  The visual symptoms persisted he had an MRI of the brain performed  05/28/2020 which showed multiple foci c/w MS, some that enhanced.    He had noted minimal balance issues off/on over the last year but was going up and down stairs without issues and carried on all his normal activities.   He had no numbness, tingling or weakness.   He had no bladder vision.    After the abnormal brain MRI, he was admitted to [facility] 05/29/19 and received 3 days of IV Solu-Medrol.  Additionally, he had MRI of the cervical thoracic spine showing additional lesions including 1 enhancing lesion to the right adjacent to C6.  He tolerated the steroid well and felt some improvement of symptoms.  He has not had any new symptoms since that time.  I saw him shortly after discharge and treatment options were discussed.  He opted to begin Ocrevus.  His first dose was October 2021  No FH of MS,   His father had B12 deficiency and needed treatment.    He is otherwise in good health.      MRI images/Labs MRI  brain 05/28/2020 shows multiple T2/FLAIR hyperintense foci.  Foci are present in the left middle cerebellar peduncle, cerebellar vermis, and in the periventricular, juxtacortical deep white matter of the hemispheres.  Several foci enhance after contrast.  The largest is in the posterior left frontal lobe.  There are also a few punctate enhancing foci in the hemispheres including a periventricular enhancing focus on the right and a juxtacortical focus in the left parietal lobe.  MRI of the cervical spine 05/30/2020 showed an enhancing focus adjacent to C6 another T2 hyperintense foci that are chronic.  These are located anteriorly adjacent to C3-C4, posterior laterally to the right adjacent to C4, posterior laterally to the left adjacent to C5, anteriorly adjacent to C5, laterally to the right adjacent to C6 (enhancing consistent with acute focus), posterior laterally to the left adjacent to C7, centrally adjacent to T1,  MRI of the thoracic spine 05/30/2020 shows several T2 hyperintense foci consistent with chronic demyelinating plaque.  No enhancing lesions.   These are located centrally adjacent to T1, posterolaterally to the left adjacent to T9,  MRI of the brain 02/26/2021 shows T2/flair hyperintense lesions in the left middle cerebellar peduncle and in the periventricular, juxtacortical and deep white matter of both hemispheres.  None of the foci enhance.  Compared to the MRI from 05/28/2020, there do not appear to be any new lesions.  The foci that enhanced in 2021 no longer do so  MRI of the cervical spine 02/26/2021 shows T2 hyperintense foci anteriorly at C3-C4, posterolaterally to the right at C4, posterolaterally to the left at C4 C5-C5, to the right at C5, to the right and the left at C5-C6, posterolaterally to the left at C6-C7 and possibly a focus to the right at that level, anterocentrally at T1.  None of the foci enhance.  Compared to the MRI from 05/30/2020 and compared side-by-side, there were no  new lesions.  The focus that enhanced in 2021 no longer does  MRI of the brain and cervical spine 03/22/2022 showed no new lesions.   Laboratory tests  05/28/2020 through 05/30/2020 show negative PCR test for SARS-CoV-2.  HIV negative.  SSA/SSB negative, Vitamin D was 38.   REVIEW OF SYSTEMS: Constitutional: No fevers, chills, sweats, or change in appetite Eyes: No visual changes, double vision, eye pain Ear, nose and throat: No hearing loss, ear pain, nasal congestion, sore throat Cardiovascular: No chest pain, palpitations Respiratory:  No shortness of  breath at rest or with exertion.   No wheezes GastrointestinaI: No nausea, vomiting, diarrhea, abdominal pain, fecal incontinence Genitourinary:  No dysuria, urinary retention or frequency.  No nocturia. Musculoskeletal:  No neck pain, back pain Integumentary: No rash, pruritus, skin lesions Neurological: as above Psychiatric: No depression at this time.  No anxiety Endocrine: No palpitations, diaphoresis, change in appetite, change in weigh or increased thirst Hematologic/Lymphatic:  No anemia, purpura, petechiae. Allergic/Immunologic: No itchy/runny eyes, nasal congestion, recent allergic reactions, rashes  ALLERGIES: No Known Allergies  HOME MEDICATIONS:  Current Outpatient Medications:    acetaminophen (TYLENOL) 500 MG tablet, Take 500 mg by mouth every 6 (six) hours as needed for moderate pain., Disp: , Rfl:    fexofenadine (ALLEGRA) 180 MG tablet, Take 180 mg by mouth as needed., Disp: , Rfl:    modafinil (PROVIGIL) 200 MG tablet, Take 1 tablet (200 mg total) by mouth daily., Disp: 90 tablet, Rfl: 1   ocrelizumab (OCREVUS) 300 MG/10ML injection, Inject into the vein every 6 (six) months. Initial dose: 300mg  IV on day 1; repeat on day 15 Maintenance dose: 600mg  IV q 6 months, Disp: , Rfl:    vitamin B-12 (CYANOCOBALAMIN) 1000 MCG tablet, Take 1 tablet (1,000 mcg total) by mouth daily., Disp: 90 tablet, Rfl: 3   Vitamin D3  (VITAMIN D) 25 MCG tablet, Take 1 tablet (1,000 Units total) by mouth daily., Disp: 90 tablet, Rfl: 3   hyoscyamine (LEVSIN) 0.125 MG tablet, Take 1 tablet (0.125 mg total) by mouth every 4 (four) hours as needed (for abdominal cramping. 4 doses a day maximum.). (Patient not taking: Reported on 04/12/2023), Disp: 90 tablet, Rfl: 5   omeprazole (PRILOSEC) 40 MG capsule, TAKE 1 CAPSULE BY MOUTH DAILY (Patient not taking: Reported on 04/12/2023), Disp: 90 capsule, Rfl: 3   triamcinolone cream (KENALOG) 0.1 %, Apply 1 Application topically 2 (two) times daily. For 7-10 days maximum. Cover with vaseline or eucerin after use. (Patient not taking: Reported on 04/12/2023), Disp: 80 g, Rfl: 0   valACYclovir (VALTREX) 1000 MG tablet, Take two pills twice, 12 hours apart.over one day prn cold sores., Disp: 20 tablet, Rfl: 1  PAST MEDICAL HISTORY: Past Medical History:  Diagnosis Date   IBS (irritable colon syndrome)    Constipation predominant. Constipation- with BM abdominal pain improves. Comes in waves with higher stress. worse with less exercise. Plan: increase exercise, watch FODMAP, consider metamucil if that doesnt work   Multiple sclerosis (HCC)     PAST SURGICAL HISTORY: Past Surgical History:  Procedure Laterality Date   ADENOIDECTOMY     2006   TONSILLECTOMY      FAMILY HISTORY: Family History  Problem Relation Age of Onset   Healthy Mother        and father   Other Father        Vit B12 def   ADD / ADHD Brother    Hyperlipidemia Other        grandparent   Hypertension Other        grandparent   Diabetes Other        grandparent   Hypothyroidism Other    Multiple sclerosis Neg Hx     SOCIAL HISTORY:  Social History   Socioeconomic History   Marital status: Married    Spouse name: Not on file   Number of children: Not on file   Years of education: Not on file   Highest education level: Not on file  Occupational History   Not on  file  Tobacco Use   Smoking status: Never    Smokeless tobacco: Never  Substance and Sexual Activity   Alcohol use: Yes    Alcohol/week: 0.0 standard drinks of alcohol    Comment: 1-2 per week   Drug use: No   Sexual activity: Not on file  Other Topics Concern   Not on file  Social History Narrative   Family: Marred. Son born November 20 2021. 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    [employer]- for Programmer, multimedia 2020   [employer]- [employer]. High achieving student      Hobbies: exercise, video games on xbox and PC, switch, learning to play guitar         Social Determinants of Health   Financial Resource Strain: Low Risk  (01/02/2023)   Received from [facility], [facility]   Overall Financial Resource Strain (CARDIA)    Difficulty of Paying Living Expenses: Not very hard  Food Insecurity: No Food Insecurity (01/02/2023)   Received from [facility], [facility]   Hunger Vital Sign    Worried About Running Out of Food in the Last Year: Never true    Ran Out of Food in the Last Year: Never true  Transportation Needs: No Transportation Needs (01/02/2023)   Received from [facility], [facility]   [facility] - Transportation    In the past 12 months, has lack of transportation kept you from medical appointments or from getting medications?: No    Lack of Transportation (Non-Medical): No  Physical Activity: Not on file  Stress: Not on file  Social Connections: Not on file  Intimate Partner Violence: Not on file     PHYSICAL EXAM  Vitals:   04/12/23 0818  BP: 138/74  Pulse: (!) 59  Weight: 150 lb (68 kg)  Height: 5\' 11"  (1.803 m)    Body mass index is 20.92 kg/m.   General: The patient is well-developed and well-nourished and in no acute distress  HEENT:  Head is [place]/AT.  Sclera are anicteric.     Sores on right lower lip and under tongue on left  Skin: Extremities are without rash or  edema.  Neurologic Exam  Mental status: The patient is alert and oriented x 3 at the time of the examination. The patient has apparent normal recent and remote memory, with an apparently normal attention span and concentration ability.   Speech is normal.  Cranial nerves: Extraocular movements are full.  Color vision was symmetric.  Facial strength is normal.  Trapezius and sternocleidomastoid strength is normal. No dysarthria is noted.  No obvious hearing deficits noted.  Motor:  Muscle bulk is normal.   Tone is normal. Strength is  5 / 5 in all 4 extremities.   Sensory: Sensory testing is intact to pinprick, soft touch and vibration sensation in all 4 extremities.  Coordination: Cerebellar testing reveals good finger-nose-finger and heel-to-shin bilaterally.  Gait and station: Station is normal.   Gait is normal.  His tandem gait is minimally wide.  Romberg is negative.  Reflexes: Deep tendon reflexes are symmetric but increased, 3 in the arms and 3+ at the knees with spread and no clonus.     DIAGNOSTIC DATA (LABS, IMAGING, TESTING) - I reviewed patient records, labs, notes, testing and imaging myself  where available.  Lab Results  Component Value Date   WBC 2.7 (L) 09/22/2022   HGB 16.4 09/22/2022   HCT 47.7 09/22/2022   MCV 85.8 09/22/2022   PLT 190.0 09/22/2022      Component Value Date/Time   NA 140 09/22/2022 0846   K 4.1 09/22/2022 0846   CL 104 09/22/2022 0846   CO2 27 09/22/2022 0846   GLUCOSE 92 09/22/2022 0846   BUN 13 09/22/2022 0846   CREATININE 0.93 09/22/2022 0846   CALCIUM 9.6 09/22/2022 0846   PROT 7.1 09/22/2022 0846   ALBUMIN 5.0 09/22/2022 0846   AST 17 09/22/2022 0846   ALT 21 09/22/2022 0846   ALKPHOS 66 09/22/2022 0846   BILITOT 2.4 (H) 09/22/2022 0846   GFRNONAA >60 05/30/2020 0546   GFRAA >60 05/30/2020 0546   Lab Results  Component Value Date    CHOL 114 06/06/2019   HDL 44.20 06/06/2019   LDLCALC 59 06/06/2019   TRIG 50.0 06/06/2019   CHOLHDL 3 06/06/2019   No results found for: "HGBA1C" Lab Results  Component Value Date   VITAMINB12 954 (H) 09/22/2022   No results found for: "TSH"     ASSESSMENT AND PLAN  Multiple sclerosis, relapsing-remitting (HCC) - Plan: MR BRAIN W WO CONTRAST  High risk medication use  Dysesthesia  Right optic neuropathy  Canker sores oral  1.   Continue Ocrevus. Has recent CBC with differential, IgG/IgM ,  Check MRI brain c/s to determine if there is any subclinical progression.  If this is occurring we need to consider a different disease modifying therapy. 2.   Stay active and exercise as tolerated.   3.   If sensory issues worsen, add gabapentin or lamotrigine 4.   Trial of Provigil for fatigue --- if no better after 1-2 months, consider a stimulant 5.   He will return to see me in 6 months or sooner for new or worsening neurologic symptoms.    Okechukwu Regnier A. Epimenio Foot, MD, [facility] 04/12/2023, 8:53 AM Certified in Neurology, Clinical Neurophysiology, Sleep Medicine and Neuroimaging  [facility] [address] [place], [place] [address] [phone]

## 2023-04-16 ENCOUNTER — Telehealth: Payer: Self-pay | Admitting: Neurology

## 2023-04-16 NOTE — Telephone Encounter (Signed)
Yetta Numbers: [id number] exp. 04/16/23-05/15/23, Clent Ridges NPR Decision ID: [id number] sent to GI for DRI Dwain Sarna [phone]

## 2023-04-20 ENCOUNTER — Other Ambulatory Visit (HOSPITAL_COMMUNITY): Payer: Self-pay

## 2023-04-20 ENCOUNTER — Telehealth: Payer: Self-pay

## 2023-04-20 NOTE — Telephone Encounter (Signed)
Pharmacy Patient Advocate Encounter   Received notification from CoverMyMeds that prior authorization for Modafinil 200MG  tablets is required/requested.  Insurance verification completed.   The patient is insured through [facility] .   Per test claim: PA required; PA submitted to [facility] via CoverMyMeds Key/confirmation #/EOC [id number] Status is pending

## 2023-04-23 ENCOUNTER — Other Ambulatory Visit (HOSPITAL_COMMUNITY): Payer: Self-pay

## 2023-04-23 NOTE — Telephone Encounter (Signed)
Pharmacy Patient Advocate Encounter  Received notification from [facility] that Prior Authorization for Modafinil 200MG  tablets has been APPROVED from 04/20/2023 to 04/19/2024. Ran test claim, Copay is $unable to obtain due to Refill Too Soon Rejection-Last fill date was 04/12/2023, Next fill date is 06/23/2023 . This test claim was processed through [facility]- copay amounts may vary at other pharmacies due to pharmacy/plan contracts, or as the patient moves through the different stages of their insurance plan.   PA #/Case ID/Reference #: PA Case ID #: [id number]

## 2023-05-01 ENCOUNTER — Ambulatory Visit
Admission: RE | Admit: 2023-05-01 | Discharge: 2023-05-01 | Disposition: A | Discharge status: Home / Self Care | Payer: BC Managed Care – PPO | Source: Ambulatory Visit | Attending: Neurology | Admitting: Neurology

## 2023-05-01 DIAGNOSIS — G35 Multiple sclerosis: Secondary | ICD-10-CM

## 2023-05-01 MED ORDER — GADOPICLENOL 0.5 MMOL/ML IV SOLN
7.0000 mL | Freq: Once | INTRAVENOUS | Status: AC | PRN
  Administered 2023-05-01: 7 mL via INTRAVENOUS

## 2023-05-03 ENCOUNTER — Encounter: Payer: Self-pay | Admitting: Neurology

## 2023-05-28 ENCOUNTER — Encounter: Payer: Self-pay | Admitting: Neurology

## 2023-05-30 NOTE — Telephone Encounter (Signed)
Pt is asking if gabapentin an option to try as discussed:    Continue Ocrevus. Has recent CBC with differential, IgG/IgM ,  Check MRI brain c/s to determine if there is any subclinical progression.  If this is occurring we need to consider a different disease modifying therapy. 2.   Stay active and exercise as tolerated.   3.   If sensory issues worsen, add gabapentin or lamotrigine 4.   Trial of Provigil for fatigue --- if no better after 1-2 months, consider a stimulant 5.   He will return to see me in 6 months or sooner for new or worsening neurologic symptoms.     Richard A. Epimenio Foot, MD, [facility] 04/12/2023, 8:53 AM

## 2023-06-03 ENCOUNTER — Other Ambulatory Visit: Payer: Self-pay | Admitting: Neurology

## 2023-06-03 MED ORDER — GABAPENTIN 300 MG PO CAPS: 300.0000 mg | ORAL_CAPSULE | Freq: Three times a day (TID) | ORAL | 5 refills | Status: DC

## 2023-06-04 ENCOUNTER — Telehealth: Payer: Self-pay

## 2023-06-04 NOTE — Telephone Encounter (Signed)
Filled out form and faxed it to [facility] at [phone]

## 2023-06-05 NOTE — Telephone Encounter (Signed)
[facility]cre[facility] form given to [facility].

## 2023-07-11 ENCOUNTER — Encounter: Payer: Self-pay | Admitting: Neurology

## 2023-09-24 ENCOUNTER — Encounter: Payer: 59 | Admitting: Family Medicine

## 2023-10-18 NOTE — Progress Notes (Signed)
 [facility]  PATIENT: Albert Edwards DOB: [birth year]  REFERRING DOCTOR OR PCP:  Garnette Lukes / Aisha Seals SOURCE: Patient, notes from hospitalization and PCP, imaging laboratory reports, MRI images personally reviewed and interpreted.  _________________________________   HISTORICAL  CHIEF COMPLAINT:  Chief Complaint  Patient presents with   Follow-up    Rm10, alone, Multiple sclerosis: extreme fatigue and brain fog with word finding difficulty, Dysesthesia:chest pain ongoing past year cardiac ruled out but chest pain is managable about 1-2 out of 10 more uncomfortable than anything, Right optic neuropathy: blurry only in shower, Canker sores oral: improving due to mouthwash prescribed to dentist [facility] brand bid        HISTORY OF PRESENT ILLNESS:  Albert Edwards is a 28 y.o. man with relapsing remitting multiple sclerosis.  Update 10/19/2023 He was placed on chlorine dioxide ([facility]) preventative mouthwash and the ulcers are much less severe now.    He had a right ankle cellulitis a needed Abx treamtent.  He sometimes has a cough and gets myalgia from this.   No fevers.  He has noted palpitations.   He does note some anxiety with a lot of stress.  He is now seeing a therapist.   He is exercising regularly.     His MS is stable.   He started Ocrevus November 2021 and had his last infusion December 2024.     He has not had any exacerbaions or new neurologic symptoms.    He had moderate infusion reactions first dose but nothing significant last time.   He has had some heartburn and takes Prilosec,     He has some burning chest discomfort left > right, sometimes upper mid abdomen, not associated with exertion or exercise.  Feels burning at 2/10 intensity.  Not much tightness.   This is worse with stress.      EKG and CXR were reportedly normal.   He has not had an Echo He is concerned about 'MS hug'  Symptoms started after scuba diving (30 minutes  at 30 feet - was forst concerned about decompression).      Gait and balance are doing well.   He runs fine.   No stumbles or falls.   He often  holds the bannister, especially if he holds the  baby on the stairs.    He walks a few mile daily. Strength and sensation are doing well.   He has not had a recent Lhermitte sign.   Blurry vision improved but still bothers him at time,often when taking a shower on the right eye only.   He has mild urinary frequency but no incontinence.   He has nocturia x 1-2.     He does not have too much fatigue most days.SABRA   He sleeps well.   He exercises but less than last visit.SABRA  He does everything he needs to and wants to do.   Mood is fine.   He has word finding difficulty but not enough to cause issues.     He did an Engineer, Agricultural at [employer]).  He is working at [employer] in [place].     He has a son born March 2023. And daughter due April 2025.     He takes Vit D and B12 daily.       MS Histroy:  He had the onset of vertigo in August 2021 and improved some with meclizine .   Shortly thereafter, he noted blurry vision while running in early  September.  The visual symptoms persisted he had an MRI of the brain performed 05/28/2020 which showed multiple foci c/w MS, some that enhanced.    He had noted minimal balance issues off/on over the last year but was going up and down stairs without issues and carried on all his normal activities.   He had no numbness, tingling or weakness.   He had no bladder vision.    After the abnormal brain MRI, he was admitted to [facility] 05/29/19 and received 3 days of IV Solu-Medrol .  Additionally, he had MRI of the cervical thoracic spine showing additional lesions including 1 enhancing lesion to the right adjacent to C6.  He tolerated the steroid well and felt some improvement of symptoms.  He has not had any new symptoms since that time.  I saw him shortly after discharge and treatment options were discussed.  He opted to begin Ocrevus.   His first dose was October 2021  No FH of MS,   His father had B12 deficiency and needed treatment.    He is otherwise in good health.      MRI images/Labs MRI brain 05/28/2020 shows multiple T2/FLAIR hyperintense foci.  Foci are present in the left middle cerebellar peduncle, cerebellar vermis, and in the periventricular, juxtacortical deep white matter of the hemispheres.  Several foci enhance after contrast.  The largest is in the posterior left frontal lobe.  There are also a few punctate enhancing foci in the hemispheres including a periventricular enhancing focus on the right and a juxtacortical focus in the left parietal lobe.  MRI of the cervical spine 05/30/2020 showed an enhancing focus adjacent to C6 another T2 hyperintense foci that are chronic.  These are located anteriorly adjacent to C3-C4, posterior laterally to the right adjacent to C4, posterior laterally to the left adjacent to C5, anteriorly adjacent to C5, laterally to the right adjacent to C6 (enhancing consistent with acute focus), posterior laterally to the left adjacent to C7, centrally adjacent to T1,  MRI of the thoracic spine 05/30/2020 shows several T2 hyperintense foci consistent with chronic demyelinating plaque.  No enhancing lesions.   These are located centrally adjacent to T1, posterolaterally to the left adjacent to T9,  MRI of the brain 02/26/2021 shows T2/flair hyperintense lesions in the left middle cerebellar peduncle and in the periventricular, juxtacortical and deep white matter of both hemispheres.  None of the foci enhance.  Compared to the MRI from 05/28/2020, there do not appear to be any new lesions.  The foci that enhanced in 2021 no longer do so  MRI of the cervical spine 02/26/2021 shows T2 hyperintense foci anteriorly at C3-C4, posterolaterally to the right at C4, posterolaterally to the left at C4 C5-C5, to the right at C5, to the right and the left at C5-C6, posterolaterally to the left at C6-C7 and  possibly a focus to the right at that level, anterocentrally at T1.  None of the foci enhance.  Compared to the MRI from 05/30/2020 and compared side-by-side, there were no new lesions.  The focus that enhanced in 2021 no longer does  MRI of the brain and cervical spine 03/22/2022 showed no new lesions.  MRI of the brain 05/01/2023 was unchanged   Laboratory tests  05/28/2020 through 05/30/2020 show negative PCR test for SARS-CoV-2.  HIV negative.  SSA/SSB negative, Vitamin D  was 38.   REVIEW OF SYSTEMS: Constitutional: No fevers, chills, sweats, or change in appetite Eyes: No visual changes, double vision, eye pain Ear,  nose and throat: No hearing loss, ear pain, nasal congestion, sore throat Cardiovascular: No chest pain, palpitations Respiratory:  No shortness of breath at rest or with exertion.   No wheezes GastrointestinaI: No nausea, vomiting, diarrhea, abdominal pain, fecal incontinence Genitourinary:  No dysuria, urinary retention or frequency.  No nocturia. Musculoskeletal:  No neck pain, back pain Integumentary: No rash, pruritus, skin lesions Neurological: as above Psychiatric: No depression at this time.  No anxiety Endocrine: No palpitations, diaphoresis, change in appetite, change in weigh or increased thirst Hematologic/Lymphatic:  No anemia, purpura, petechiae. Allergic/Immunologic: No itchy/runny eyes, nasal congestion, recent allergic reactions, rashes  ALLERGIES: No Active Allergies  HOME MEDICATIONS:  Current Outpatient Medications:    acetaminophen  (TYLENOL ) 500 MG tablet, Take 500 mg by mouth every 6 (six) hours as needed for moderate pain., Disp: , Rfl:    fexofenadine (ALLEGRA) 180 MG tablet, Take 180 mg by mouth as needed., Disp: , Rfl:    gabapentin  (NEURONTIN ) 300 MG capsule, Take 1 capsule (300 mg total) by mouth 3 (three) times daily., Disp: 90 capsule, Rfl: 5   modafinil  (PROVIGIL ) 200 MG tablet, Take 1 tablet (200 mg total) by mouth daily., Disp: 90  tablet, Rfl: 1   ocrelizumab (OCREVUS) 300 MG/10ML injection, Inject into the vein every 6 (six) months. Initial dose: 300mg  IV on day 1; repeat on day 15 Maintenance dose: 600mg  IV q 6 months, Disp: , Rfl:    triamcinolone  cream (KENALOG ) 0.1 %, Apply 1 Application topically 2 (two) times daily. For 7-10 days maximum. Cover with vaseline or eucerin after use., Disp: 80 g, Rfl: 0   valACYclovir  (VALTREX ) 1000 MG tablet, Take two pills twice, 12 hours apart.over one day prn cold sores., Disp: 20 tablet, Rfl: 1   vitamin B-12 (CYANOCOBALAMIN ) 1000 MCG tablet, Take 1 tablet (1,000 mcg total) by mouth daily., Disp: 90 tablet, Rfl: 3   Vitamin D3 (VITAMIN D ) 25 MCG tablet, Take 1 tablet (1,000 Units total) by mouth daily., Disp: 90 tablet, Rfl: 3  PAST MEDICAL HISTORY: Past Medical History:  Diagnosis Date   IBS (irritable colon syndrome)    Constipation predominant. Constipation- with BM abdominal pain improves. Comes in waves with higher stress. worse with less exercise. Plan: increase exercise, watch FODMAP, consider metamucil if that doesnt work   Multiple sclerosis (HCC)     PAST SURGICAL HISTORY: Past Surgical History:  Procedure Laterality Date   ADENOIDECTOMY     2006   TONSILLECTOMY      FAMILY HISTORY: Family History  Problem Relation Age of Onset   Healthy Mother        and father   Other Father        Vit B12 def   ADD / ADHD Brother    Hyperlipidemia Other        grandparent   Hypertension Other        grandparent   Diabetes Other        grandparent   Hypothyroidism Other    Multiple sclerosis Neg Hx     SOCIAL HISTORY:  Social History   Socioeconomic History   Marital status: Married    Spouse name: courtney   Number of children: 1   Years of education: Not on file   Highest education level: Master's degree (e.g., MA, MS, MEng, MEd, MSW, MBA)  Occupational History   Not on file  Tobacco Use   Smoking status: Never    Passive exposure: Never   Smokeless  tobacco: Never  Vaping Use   Vaping status: Never Used  Substance and Sexual Activity   Alcohol use: Yes    Alcohol/week: 1.0 standard drink of alcohol    Types: 1 Standard drinks or equivalent per week    Comment: 1-2 per week   Drug use: No   Sexual activity: Yes    Birth control/protection: None  Other Topics Concern   Not on file  Social History Narrative   Family: Marred. Son born November 20 2021. 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 High achieving student      Hobbies: exercise, video games on xbox and PC, switch, learning to play guitar         Social Drivers of Health   Financial Resource Strain: Low Risk  (09/19/2023)   [facility]   Overall Financial Resource Strain (CARDIA)    Difficulty of Paying Living Expenses: Not hard at all  Food Insecurity: No Food Insecurity (09/19/2023)   [facility] from [facility]   Hunger Vital Sign    Worried About Running Out of Food in the Last Year: Never true    Ran Out of Food in the Last Year: Never true  Transportation Needs: No Transportation Needs (09/19/2023)   Received from [facility] - Transportation    In the past 12 months, has lack of transportation kept you from medical appointments or from getting medications?: No    Lack of Transportation (Non-Medical): No  Physical Activity: Not on file  Stress: Not on file  Social Connections: Not on file  Intimate Partner Violence: Not on file     PHYSICAL EXAM  Vitals:   10/19/23 1048  BP: 130/80  Pulse: (!) 118  Resp: 16  Weight: 144 lb 8 oz (65.5 kg)  Height: 5' 11 (1.803 m)     Body mass index is 20.15 kg/m.   General: The patient is well-developed and well-nourished and in no acute distress  HEENT:  Head is  [place]/AT.  Sclera are anicteric.    Sores on right lower lip and under tongue on left  Skin: Extremities are without rash or  edema.  Neurologic Exam  Mental status: The patient is alert and oriented x 3 at the time of the examination. The patient has apparent normal recent and remote memory, with an apparently normal attention span and concentration ability.   Speech is normal.  Cranial nerves: Extraocular movements are full.  Color vision was symmetric.  Facial strength is normal.  Trapezius and sternocleidomastoid strength is normal. No dysarthria is noted.  No obvious hearing deficits noted.  Motor:  Muscle bulk is normal.   Tone is normal. Strength is  5 / 5 in all 4 extremities.   Sensory: Sensory testing is intact to pinprick, soft touch and vibration sensation in all 4 extremities.  Coordination: Cerebellar testing reveals good finger-nose-finger and heel-to-shin bilaterally.  Gait and station: Station is normal.   The gait is normal and the tandem gait is minimally wide..  Romberg is negative.  Reflexes: Deep tendon reflexes are symmetric but increased, 3 in the arms and 3+ at the knees with spread and no clonus.     DIAGNOSTIC DATA (LABS, IMAGING, TESTING) - I reviewed patient records, labs, notes,  testing and imaging myself where available.  Lab Results  Component Value Date   WBC 3.2 (L) 04/12/2023   HGB 15.7 04/12/2023   HCT 47.5 04/12/2023   MCV 89 04/12/2023   PLT 195 04/12/2023      Component Value Date/Time   NA 140 09/22/2022 0846   K 4.1 09/22/2022 0846   CL 104 09/22/2022 0846   CO2 27 09/22/2022 0846   GLUCOSE 92 09/22/2022 0846   BUN 13 09/22/2022 0846   CREATININE 0.93 09/22/2022 0846   CALCIUM 9.6 09/22/2022 0846   PROT 7.1 09/22/2022 0846   ALBUMIN 5.0 09/22/2022 0846   AST 17 09/22/2022 0846   ALT 21 09/22/2022 0846   ALKPHOS 66 09/22/2022 0846   BILITOT 2.4 (H) 09/22/2022 0846   GFRNONAA >60 05/30/2020 0546   GFRAA >60 05/30/2020 0546   Lab  Results  Component Value Date   CHOL 114 06/06/2019   HDL 44.20 06/06/2019   LDLCALC 59 06/06/2019   TRIG 50.0 06/06/2019   CHOLHDL 3 06/06/2019   No results found for: HGBA1C Lab Results  Component Value Date   VITAMINB12 954 (H) 09/22/2022   No results found for: TSH     ASSESSMENT AND PLAN  Multiple sclerosis, relapsing-remitting (HCC)  High risk medication use  Dysesthesia  Right optic neuropathy  Chest pain, unspecified type  Urinary frequency  1.   We will continue Ocrevus.  His MS has been stable while on it.  No exacerbations.. Has recent CBC with differential, IgG/IgM , will check another MRI of the brain later in the year.  2.   Stay active and exercise as tolerated.   3.   If tight dysesthetic sensory issues worsen, add gabapentin  or lamotrigine 4.   Stay active and exercise as tolerated.  5.   He will return to see me in 6 months or sooner for new or worsening neurologic symptoms.    Merlina Marchena A. Vear, MD, [facility] 10/19/2023, 11:11 AM Certified in Neurology, Clinical Neurophysiology, Sleep Medicine and Neuroimaging  [facility] [address], [address] [place], [place] [address] [phone]

## 2023-10-19 ENCOUNTER — Ambulatory Visit (INDEPENDENT_AMBULATORY_CARE_PROVIDER_SITE_OTHER): Payer: Commercial Managed Care - PPO | Admitting: Neurology

## 2023-10-19 ENCOUNTER — Encounter: Payer: Self-pay | Admitting: Neurology

## 2023-10-19 VITALS — BP 130/80 | HR 118 | Resp 16 | Ht 71.0 in | Wt 144.5 lb

## 2023-10-19 DIAGNOSIS — R079 Chest pain, unspecified: Secondary | ICD-10-CM

## 2023-10-19 DIAGNOSIS — Z79899 Other long term (current) drug therapy: Secondary | ICD-10-CM | POA: Diagnosis not present

## 2023-10-19 DIAGNOSIS — G35 Multiple sclerosis: Secondary | ICD-10-CM

## 2023-10-19 DIAGNOSIS — R208 Other disturbances of skin sensation: Secondary | ICD-10-CM

## 2023-10-19 DIAGNOSIS — H469 Unspecified optic neuritis: Secondary | ICD-10-CM

## 2023-10-19 DIAGNOSIS — R350 Frequency of micturition: Secondary | ICD-10-CM

## 2023-10-19 MED ORDER — GABAPENTIN 300 MG PO CAPS: 300.0000 mg | ORAL_CAPSULE | Freq: Three times a day (TID) | ORAL | 5 refills | Status: DC

## 2023-10-20 LAB — CBC WITH DIFFERENTIAL/PLATELET
Basophils Absolute: 0 10*3/uL (ref 0.0–0.2)
Basos: 2 %
EOS (ABSOLUTE): 0.1 10*3/uL (ref 0.0–0.4)
Eos: 5 %
Hematocrit: 46.4 % (ref 37.5–51.0)
Hemoglobin: 16 g/dL (ref 13.0–17.7)
Immature Grans (Abs): 0 10*3/uL (ref 0.0–0.1)
Immature Granulocytes: 0 %
Lymphocytes Absolute: 1 10*3/uL (ref 0.7–3.1)
Lymphs: 37 %
MCH: 29.5 pg (ref 26.6–33.0)
MCHC: 34.5 g/dL (ref 31.5–35.7)
MCV: 86 fL (ref 79–97)
Monocytes Absolute: 0.7 10*3/uL (ref 0.1–0.9)
Monocytes: 28 %
Neutrophils Absolute: 0.7 10*3/uL — ABNORMAL LOW (ref 1.4–7.0)
Neutrophils: 28 %
Platelets: 288 10*3/uL (ref 150–450)
RBC: 5.42 x10E6/uL (ref 4.14–5.80)
RDW: 13.1 % (ref 11.6–15.4)
WBC: 2.6 10*3/uL — ABNORMAL LOW (ref 3.4–10.8)

## 2023-10-20 LAB — IGG, IGA, IGM
IgA/Immunoglobulin A, Serum: 130 mg/dL (ref 90–386)
IgG (Immunoglobin G), Serum: 823 mg/dL (ref 603–1613)
IgM (Immunoglobulin M), Srm: 37 mg/dL (ref 20–172)

## 2023-10-21 ENCOUNTER — Other Ambulatory Visit: Payer: Self-pay | Admitting: Neurology

## 2023-10-21 ENCOUNTER — Encounter: Payer: Self-pay | Admitting: Neurology

## 2023-10-21 DIAGNOSIS — Z79899 Other long term (current) drug therapy: Secondary | ICD-10-CM

## 2023-10-21 DIAGNOSIS — D702 Other drug-induced agranulocytosis: Secondary | ICD-10-CM

## 2023-10-21 DIAGNOSIS — G35 Multiple sclerosis: Secondary | ICD-10-CM

## 2023-10-22 ENCOUNTER — Ambulatory Visit: Payer: BC Managed Care – PPO | Admitting: Neurology

## 2023-10-22 LAB — CBC WITH DIFFERENTIAL/PLATELET

## 2023-10-30 ENCOUNTER — Encounter: Payer: Self-pay | Admitting: Neurology

## 2023-10-30 ENCOUNTER — Other Ambulatory Visit: Payer: Self-pay | Admitting: Neurology

## 2023-10-30 DIAGNOSIS — D709 Neutropenia, unspecified: Secondary | ICD-10-CM

## 2023-10-30 DIAGNOSIS — G35 Multiple sclerosis: Secondary | ICD-10-CM

## 2023-10-30 DIAGNOSIS — G35A Relapsing-remitting multiple sclerosis: Secondary | ICD-10-CM

## 2023-10-30 DIAGNOSIS — Z79899 Other long term (current) drug therapy: Secondary | ICD-10-CM

## 2023-10-30 LAB — IGG, IGA, IGM

## 2023-10-30 LAB — CBC WITH DIFFERENTIAL/PLATELET

## 2023-11-28 ENCOUNTER — Telehealth: Payer: Self-pay | Admitting: Neurology

## 2023-11-28 ENCOUNTER — Encounter: Payer: Self-pay | Admitting: Neurology

## 2023-11-28 DIAGNOSIS — G35 Multiple sclerosis: Secondary | ICD-10-CM

## 2023-11-28 DIAGNOSIS — D709 Neutropenia, unspecified: Secondary | ICD-10-CM

## 2023-11-28 DIAGNOSIS — D702 Other drug-induced agranulocytosis: Secondary | ICD-10-CM

## 2023-11-28 NOTE — Telephone Encounter (Signed)
 There was a 28 year old man who is on Ocrevus therapy.  His MS is doing very well.  Unfortunately, neutrophil counts have dropped much further.  Last time we checked they were 0.7 and yesterday they were 0.1 putting him in a critical range.  I discussed that I would like him to see a hematologist as he may need treatment like G-CSF.  We briefly discussed some neutropenic precautions like washing hands and not working in soil.  We will need to consider a different treatment

## 2023-11-28 NOTE — Telephone Encounter (Signed)
 Urgent hematology/oncology referral faxed to [facility] (fax# [phone], phone# [phone])

## 2023-11-29 ENCOUNTER — Telehealth: Payer: Self-pay | Admitting: *Deleted

## 2023-11-29 LAB — CBC WITH DIFFERENTIAL/PLATELET
Basophils Absolute: 0 10*3/uL (ref 0.0–0.2)
Basos: 1 %
EOS (ABSOLUTE): 0.2 10*3/uL (ref 0.0–0.4)
Eos: 9 %
Hematocrit: 46.5 % (ref 37.5–51.0)
Hemoglobin: 15.7 g/dL (ref 13.0–17.7)
Immature Grans (Abs): 0 10*3/uL (ref 0.0–0.1)
Immature Granulocytes: 0 %
Lymphocytes Absolute: 1.2 10*3/uL (ref 0.7–3.1)
Lymphs: 57 %
MCH: 29.6 pg (ref 26.6–33.0)
MCHC: 33.8 g/dL (ref 31.5–35.7)
MCV: 88 fL (ref 79–97)
Monocytes Absolute: 0.6 10*3/uL (ref 0.1–0.9)
Monocytes: 30 %
Neutrophils Absolute: 0.1 10*3/uL — CL (ref 1.4–7.0)
Neutrophils: 3 %
Platelets: 200 10*3/uL (ref 150–450)
RBC: 5.3 x10E6/uL (ref 4.14–5.80)
RDW: 13 % (ref 11.6–15.4)
WBC: 2.1 10*3/uL — CL (ref 3.4–10.8)

## 2023-11-29 NOTE — Telephone Encounter (Signed)
 Albert Edwards is redirecting referral to [facility]

## 2023-11-29 NOTE — Telephone Encounter (Signed)
Dr. Sater- FYI 

## 2023-11-29 NOTE — Telephone Encounter (Signed)
 Received a call back from Alta at [facility] center. Stated she discussed the case with one of their providers and can see him tomorrow at [facility] location at 2:30. She is going to call patient to confirm this will work for him.

## 2023-11-29 NOTE — Telephone Encounter (Signed)
 Call from Arianna at [facility] regarding a patient with MS that has developed neutropenia. Asking how far out are we scheduling hematology? Phone #[phone] Forwarded message to nurse navigator

## 2023-11-30 ENCOUNTER — Inpatient Hospital Stay: Attending: Oncology | Admitting: Oncology

## 2023-11-30 ENCOUNTER — Inpatient Hospital Stay: Admitting: Oncology

## 2023-11-30 ENCOUNTER — Encounter: Payer: Self-pay | Admitting: Oncology

## 2023-11-30 VITALS — BP 153/88 | HR 105 | Temp 98.7°F | Resp 16 | Wt 149.5 lb

## 2023-11-30 DIAGNOSIS — G35 Multiple sclerosis: Secondary | ICD-10-CM

## 2023-11-30 DIAGNOSIS — K120 Recurrent oral aphthae: Secondary | ICD-10-CM

## 2023-11-30 DIAGNOSIS — D702 Other drug-induced agranulocytosis: Secondary | ICD-10-CM | POA: Diagnosis present

## 2023-11-30 DIAGNOSIS — Z79899 Other long term (current) drug therapy: Secondary | ICD-10-CM | POA: Diagnosis not present

## 2023-11-30 DIAGNOSIS — D709 Neutropenia, unspecified: Secondary | ICD-10-CM | POA: Diagnosis not present

## 2023-11-30 DIAGNOSIS — T50995A Adverse effect of other drugs, medicaments and biological substances, initial encounter: Secondary | ICD-10-CM | POA: Insufficient documentation

## 2023-11-30 LAB — CBC WITH DIFFERENTIAL (CANCER CENTER ONLY)
Abs Immature Granulocytes: 0.01 10*3/uL (ref 0.00–0.07)
Basophils Absolute: 0 10*3/uL (ref 0.0–0.1)
Basophils Relative: 1 %
Eosinophils Absolute: 0.1 10*3/uL (ref 0.0–0.5)
Eosinophils Relative: 5 %
HCT: 46 % (ref 39.0–52.0)
Hemoglobin: 15.4 g/dL (ref 13.0–17.0)
Immature Granulocytes: 1 %
Lymphocytes Relative: 44 %
Lymphs Abs: 1 10*3/uL (ref 0.7–4.0)
MCH: 29.1 pg (ref 26.0–34.0)
MCHC: 33.5 g/dL (ref 30.0–36.0)
MCV: 86.8 fL (ref 80.0–100.0)
Monocytes Absolute: 0.7 10*3/uL (ref 0.1–1.0)
Monocytes Relative: 34 %
Neutro Abs: 0.3 10*3/uL — CL (ref 1.7–7.7)
Neutrophils Relative %: 15 %
Platelet Count: 183 10*3/uL (ref 150–400)
RBC: 5.3 MIL/uL (ref 4.22–5.81)
RDW: 12.8 % (ref 11.5–15.5)
WBC Count: 2.1 10*3/uL — ABNORMAL LOW (ref 4.0–10.5)
nRBC: 0 % (ref 0.0–0.2)

## 2023-11-30 LAB — RETICULOCYTES
Immature Retic Fract: 4.9 % (ref 2.3–15.9)
RBC.: 5.31 MIL/uL (ref 4.22–5.81)
Retic Count, Absolute: 95 10*3/uL (ref 19.0–186.0)
Retic Ct Pct: 1.8 % (ref 0.4–3.1)

## 2023-11-30 LAB — IRON AND IRON BINDING CAPACITY (CC-WL,HP ONLY)
Iron: 49 ug/dL (ref 45–182)
Saturation Ratios: 14 % — ABNORMAL LOW (ref 17.9–39.5)
TIBC: 356 ug/dL (ref 250–450)
UIBC: 307 ug/dL (ref 117–376)

## 2023-11-30 LAB — CMP (CANCER CENTER ONLY)
ALT: 14 U/L (ref 0–44)
AST: 17 U/L (ref 15–41)
Albumin: 5.2 g/dL — ABNORMAL HIGH (ref 3.5–5.0)
Alkaline Phosphatase: 64 U/L (ref 38–126)
Anion gap: 6 (ref 5–15)
BUN: 13 mg/dL (ref 6–20)
CO2: 30 mmol/L (ref 22–32)
Calcium: 9.3 mg/dL (ref 8.9–10.3)
Chloride: 106 mmol/L (ref 98–111)
Creatinine: 0.86 mg/dL (ref 0.61–1.24)
GFR, Estimated: >60 mL/min (ref >60)
Glucose, Bld: 109 mg/dL — ABNORMAL HIGH (ref 70–99)
Potassium: 3.9 mmol/L (ref 3.5–5.1)
Sodium: 142 mmol/L (ref 135–145)
Total Bilirubin: 1.9 mg/dL — ABNORMAL HIGH (ref 0.0–1.2)
Total Protein: 7.5 g/dL (ref 6.5–8.1)

## 2023-11-30 LAB — VITAMIN B12: Vitamin B-12: 910 pg/mL (ref 180–914)

## 2023-11-30 LAB — TSH: TSH: 1.079 u[IU]/mL (ref 0.350–4.500)

## 2023-11-30 LAB — LACTATE DEHYDROGENASE: LDH: 128 U/L (ref 98–192)

## 2023-11-30 LAB — FERRITIN: Ferritin: 93 ng/mL (ref 24–336)

## 2023-11-30 MED ORDER — CIPROFLOXACIN HCL 500 MG PO TABS
500.0000 mg | ORAL_TABLET | Freq: Two times a day (BID) | ORAL | 1 refills | Status: DC
Start: 2023-11-30 — End: 2024-01-10

## 2023-11-30 NOTE — Progress Notes (Signed)
 CRITICAL VALUE STICKER  CRITICAL VALUE: ANC 0.3  RECEIVER (on-site recipient of call): M. Sherl Yzaguirre/ E. Wood-Suarez  DATE & TIME NOTIFIED: 11/30/2023 1555  MESSENGER (representative from lab): Katelyn  MD NOTIFIED: Pasam  TIME OF NOTIFICATION: 1558  RESPONSE:  no additional action/no new orders given

## 2023-11-30 NOTE — Assessment & Plan Note (Signed)
 Diagnosed in September 2021 and managed with Ocrevus (ocrelizumab) since then. His MS symptoms are well-controlled, with stable imaging over the past few years. The current concern is the potential side effect of neutropenia from Ocrevus. The medication has been effective in controlling MS symptoms, and the timeline of neutropenia aligns with known delayed onset neutropenia associated with Ocrevus.  On literature review, late onset neutropenia from ocrelizumab could have been anywhere between 40 days from last infusion, up until 330 days.  If persistent neutropenia is noted, alternative medication may have to be planned for him and we will defer this to his neurologist.

## 2023-11-30 NOTE — Assessment & Plan Note (Addendum)
 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 Neutrophil counts have been fluctuating since January 2024, with the most recent count critically low at 100 on 11/27/2023.   He has experienced recurrent infections and canker sores in the past, likely related to the low neutrophil count. The risk of infection is high when neutrophils are below 500.   Medication-induced neutropenia is suspected, but further tests will be conducted to rule out other causes such as vitamin deficiencies or autoimmune conditions.   He is currently asymptomatic for fever, chills, or night sweats. Prophylactic antibiotics are considered to prevent infection until the ANC is above 500.  I started him empirically on ciprofloxacin 500 mg p.o. twice daily for neutropenic prophylaxis.  Labs today showed white count of 2100 with ANC of 300.  Remaining blood counts are all within normal limits.  Iron studies show no evidence of iron deficiency.  B12, folic acid, copper, as well as autoimmune markers were checked today.  Results pending.  - Prescribe ciprofloxacin 500 mg twice daily for 10 days as prophylactic antibiotic coverage.  Most likely etiology for his neutropenia is Ocrevus.    Submitted request for approval of Zarxio or one of the short acting G-CSF agents to help improve his neutrophil count to minimize risk of severe infection.  Our clinic will arrange injection appointments, once this is authorized.  Will continue Zarxio until neutrophil count is at least above 1000.  - Advise avoiding contact with sick individuals and consuming only cooked foods.  - Recommend wearing a mask in crowded or high-risk environments.  If prolonged neutropenia is noted, he may need antiviral and antifungal prophylaxis as well.  They are expecting their second child in approximately 3 weeks from now.  Will coordinate appointments accordingly.

## 2023-11-30 NOTE — Progress Notes (Signed)
 [facility]  HEMATOLOGY CLINIC CONSULTATION NOTE   PATIENT NAME: Albert Edwards   MR#: [id number] DOB: November 21, 1995  DATE OF SERVICE: 11/30/2023   REFERRING PHYSICIAN  Despina Arias, MD, Neurology.   Patient Care Team: Cloyd Stagers, DO as PCP - General (Family Medicine) Sater, Pearletha Furl, MD (Neurology)   REASON FOR CONSULTATION/ CHIEF COMPLAINT:  Leukopenia  ASSESSMENT & PLAN:  Albert Edwards is a 28 y.o. gentleman with a past medical history of multiple sclerosis currently receiving ocrelizumab, irritable bowel syndrome, was referred to our service for evaluation of leukopenia.    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 Neutrophil counts have been fluctuating since January 2024, with the most recent count critically low at 100 on 11/27/2023.   He has experienced recurrent infections and canker sores in the past, likely related to the low neutrophil count. The risk of infection is high when neutrophils are below 500.   Medication-induced neutropenia is suspected, but further tests will be conducted to rule out other causes such as vitamin deficiencies or autoimmune conditions.   He is currently asymptomatic for fever, chills, or night sweats. Prophylactic antibiotics are considered to prevent infection until the ANC is above 500.  I started him empirically on ciprofloxacin 500 mg p.o. twice daily for neutropenic prophylaxis.  Labs today showed white count of 2100 with ANC of 300.  Remaining blood counts are all within normal limits.  Iron studies show no evidence of iron deficiency.  B12, folic acid, copper, as well as autoimmune markers were checked today.  Results pending.  - Prescribe ciprofloxacin 500 mg twice daily for 10 days as prophylactic antibiotic coverage.  Most likely etiology for his neutropenia is Ocrevus.    Submitted request for approval  of Zarxio or one of the short acting G-CSF agents to help improve his neutrophil count to minimize risk of severe infection.  Our clinic will arrange injection appointments, once this is authorized.  Will continue Zarxio until neutrophil count is at least above 1000.  - Advise avoiding contact with sick individuals and consuming only cooked foods.  - Recommend wearing a mask in crowded or high-risk environments.  If prolonged neutropenia is noted, he may need antiviral and antifungal prophylaxis as well.  They are expecting their second child in approximately 3 weeks from now.  Will coordinate appointments accordingly.  Multiple sclerosis, relapsing-remitting (HCC) Diagnosed in September 2021 and managed with Ocrevus (ocrelizumab) since then. His MS symptoms are well-controlled, with stable imaging over the past few years. The current concern is the potential side effect of neutropenia from Ocrevus. The medication has been effective in controlling MS symptoms, and the timeline of neutropenia aligns with known delayed onset neutropenia associated with Ocrevus.  On literature review, late onset neutropenia from ocrelizumab could have been anywhere between 40 days from last infusion, up until 330 days.  If persistent neutropenia is noted, alternative medication may have to be planned for him and we will defer this to his neurologist.   I reviewed lab results and outside records for this visit and discussed relevant results with the patient. Diagnosis, plan of care and treatment options were also discussed in detail with the patient. Opportunity provided to ask questions and answers provided to his apparent satisfaction. Provided instructions to call our clinic with any problems, questions or concerns prior to return visit. I recommended to continue follow-up with PCP and sub-specialists. He verbalized understanding and agreed with  the plan. No barriers to learning was detected.  Meryl Crutch, MD   11/30/2023 5:00 PM  [facility] - [facility] [address] [place] [address] Dept: [phone] Dept Fax: [phone]   HISTORY OF PRESENT ILLNESS:  Discussed the use of AI scribe software for clinical note transcription with the patient, who gave verbal consent to proceed.   On 11/27/2023, labs at his neurologist office showed white count of 2100, ANC of 100, ALC of 1200.  Hemoglobin 15.7, platelet count 200,000.  Hence a referral was sent to [place] for further evaluation of neutropenia and leukopenia.  On review of records, he had leukopenia with white count of 2600 on 10/19/2023, ANC 700 at that time.  He also had leukopenia in January 2024 when the white count was 2100, ANC of 1200.  He has a history of Multiple Sclerosis (MS) managed with Ocrevus since diagnosis in September 2021, presents with concerns about a recent finding of neutropenia. The patient reports frequent illnesses and canker sores, which have been significant enough at times to interfere with eating and speaking. He also mentions a prolonged cough lasting about four weeks from late January through February. The patient had a bout of cellulitis in September 2024, which was resolved with antibiotics. He also reports occasional red bumps on the chest and back, which do not cause discomfort and resolve on their own. The patient's MS has been stable with no new symptoms or changes in imaging. He also takes vitamin B12 and D3 supplements daily.  He denies fever, cough, diarrhea, or other infectious symptoms.  He also denies unintentional weight loss, night sweats or other constitutional symptoms.  MEDICAL HISTORY Past Medical History:  Diagnosis Date   IBS (irritable colon syndrome)    Constipation predominant. Constipation- with BM abdominal pain improves. Comes in waves with higher stress. worse with less exercise. Plan: increase exercise, watch  FODMAP, consider metamucil if that doesnt work   Multiple sclerosis [facility])      SURGICAL HISTORY Past Surgical History:  Procedure Laterality Date   ADENOIDECTOMY     2006   TONSILLECTOMY       SOCIAL HISTORY: He reports that he has never smoked. He has never been exposed to tobacco smoke. He has never used smokeless tobacco. He reports current alcohol use of about 1.0 standard drink of alcohol per week. He reports that he does not use drugs. Social History   Socioeconomic History   Marital status: Married    Spouse name: courtney   Number of children: 1   Years of education: Not on file   Highest education level: Master's degree (e.g., MA, MS, MEng, MEd, MSW, MBA)  Occupational History   Not on file  Tobacco Use   Smoking status: Never    Passive exposure: Never   Smokeless tobacco: Never  Vaping Use   Vaping status: Never Used  Substance and Sexual Activity   Alcohol use: Yes    Alcohol/week: 1.0 standard drink of alcohol    Types: 1 Standard drinks or equivalent per week    Comment: 1-2 per week   Drug use: No   Sexual activity: Yes    Birth control/protection: None  Other Topics Concern   Not on file  Social History Narrative   Family: Marred. Son born November 20 2021. 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    - for Programmer, multimedia 2020   HS- [employer]. High achieving student      Hobbies: exercise, video games on xbox and PC, switch, learning to play guitar         Social Drivers of Health   Financial Resource Strain: Low Risk  (09/19/2023)   Received from [facility]   Overall Financial Resource Strain (CARDIA)    Difficulty of Paying Living Expenses: Not hard at all  Food Insecurity: No Food Insecurity (11/30/2023)   Hunger Vital Sign    Worried About Running Out of Food in the Last Year: Never true    Ran Out of  Food in the Last Year: Never true  Transportation Needs: No Transportation Needs (11/30/2023)   PRAPARE - Administrator, Civil Service (Medical): No    Lack of Transportation (Non-Medical): No  Physical Activity: Not on file  Stress: Not on file  Social Connections: Not on file  Intimate Partner Violence: Not At Risk (11/30/2023)   Humiliation, Afraid, Rape, and Kick questionnaire    Fear of Current or Ex-Partner: No    Emotionally Abused: No    Physically Abused: No    Sexually Abused: No    FAMILY HISTORY: His family history includes ADD / ADHD in his brother; Diabetes in an other family member; Healthy in his mother; Hyperlipidemia in an other family member; Hypertension in an other family member; Hypothyroidism in an other family member; Other in his father.  CURRENT MEDICATIONS   Current Outpatient Medications  Medication Instructions   acetaminophen (TYLENOL) 500 mg, Every 6 hours PRN   ciprofloxacin (CIPRO) 500 mg, Oral, 2 times daily   cyanocobalamin (VITAMIN B12) 1,000 mcg, Oral, Daily   fexofenadine (ALLEGRA) 180 mg, As needed   gabapentin (NEURONTIN) 300 mg, Oral, 3 times daily   ocrelizumab (OCREVUS) 300 MG/10ML injection Every 6 months   triamcinolone cream (KENALOG) 0.1 % 1 Application, Topical, 2 times daily, For 7-10 days maximum. Cover with vaseline or eucerin after use.   Vitamin D-1000 Max St 2,000 Units, Daily     ALLERGIES  He has no known allergies.  REVIEW OF SYSTEMS:  Review of Systems - Oncology   Rest of the pertinent review of systems is unremarkable except as mentioned above in HPI.  PHYSICAL EXAMINATION:    Onc Performance Status - 11/30/23 1440       ECOG Perf Status   ECOG Perf Status Restricted in physically strenuous activity but ambulatory and able to carry out work of a light or sedentary nature, e.g., light house work, office work      KPS SCALE   KPS % SCORE Able to carry on normal activity, minor s/s of disease              Vitals:   11/30/23 1426  BP: (!) 153/88  Pulse: (!) 105  Resp: 16  Temp: 98.7 F (37.1 C)  SpO2: 97%   Filed Weights   11/30/23 1426  Weight: 149 lb 8 oz (67.8 kg)    Physical Exam Constitutional:      General: He is not in acute distress.    Appearance: Normal appearance.  HENT:     Head: Normocephalic and atraumatic.  Eyes:     General: No scleral icterus.    Conjunctiva/sclera: Conjunctivae normal.  Cardiovascular:     Rate and Rhythm: Normal rate and regular rhythm.  Pulmonary:     Effort: Pulmonary effort is normal.  Abdominal:     General: There is no distension.  Neurological:     General: No focal deficit present.     Mental Status: He is alert and oriented to person, place, and time.  Psychiatric:        Mood and Affect: Mood normal.        Behavior: Behavior normal.        Thought Content: Thought content normal.      LABORATORY DATA:   I have reviewed the data as listed.  Results for orders placed or performed in visit on 11/30/23  Iron and Iron Binding Capacity ([facility] only)  Result Value Ref Range   Iron 49 45 - 182 ug/dL   TIBC 161 096 - 045 ug/dL   Saturation Ratios 14 (L) 17.9 - 39.5 %   UIBC 307 117 - 376 ug/dL  Reticulocytes  Result Value Ref Range   Retic Ct Pct 1.8 0.4 - 3.1 %   RBC. 5.31 4.22 - 5.81 MIL/uL   Retic Count, Absolute 95.0 19.0 - 186.0 K/uL   Immature Retic Fract 4.9 2.3 - 15.9 %  Lactate dehydrogenase  Result Value Ref Range   LDH 128 98 - 192 U/L  CMP (Cancer Center only)  Result Value Ref Range   Sodium 142 135 - 145 mmol/L   Potassium 3.9 3.5 - 5.1 mmol/L   Chloride 106 98 - 111 mmol/L   CO2 30 22 - 32 mmol/L   Glucose, Bld 109 (H) 70 - 99 mg/dL   BUN 13 6 - 20 mg/dL   Creatinine 4.09 8.11 - 1.24 mg/dL   Calcium 9.3 8.9 - 91.4 mg/dL   Total Protein 7.5 6.5 - 8.1 g/dL   Albumin 5.2 (H) 3.5 - 5.0 g/dL   AST 17 15 - 41 U/L   ALT 14 0 - 44 U/L   Alkaline Phosphatase 64 38 - 126 U/L   Total  Bilirubin 1.9 (H) 0.0 - 1.2 mg/dL   GFR, Estimated >78 >29 mL/min   Anion gap 6 5 - 15  CBC with Differential (Cancer Center Only)  Result Value Ref Range   WBC Count 2.1 (L) 4.0 - 10.5 K/uL   RBC 5.30 4.22 - 5.81 MIL/uL   Hemoglobin 15.4 13.0 - 17.0 g/dL   HCT 56.2 13.0 - 86.5 %   MCV 86.8 80.0 - 100.0 fL   MCH 29.1 26.0 - 34.0 pg   MCHC 33.5 30.0 - 36.0 g/dL   RDW 78.4 69.6 - 29.5 %   Platelet Count 183 150 - 400 K/uL   nRBC 0.0 0.0 - 0.2 %   Neutrophils Relative % 15 %   Neutro Abs 0.3 (LL) 1.7 - 7.7 K/uL   Lymphocytes Relative 44 %   Lymphs Abs 1.0 0.7 - 4.0 K/uL   Monocytes Relative 34 %   Monocytes Absolute 0.7 0.1 - 1.0 K/uL   Eosinophils Relative 5 %   Eosinophils Absolute 0.1 0.0 - 0.5 K/uL   Basophils Relative 1 %   Basophils Absolute 0.0 0.0 - 0.1 K/uL   Immature Granulocytes 1 %   Abs Immature Granulocytes 0.01 0.00 - 0.07 K/uL    RADIOGRAPHIC STUDIES:  No pertinent imaging studies available to review.  Orders Placed This Encounter  Procedures   CBC with Differential (Cancer Center Only)    Standing Status:   Future    Number of Occurrences:   1  Expiration Date:   11/29/2024   CMP (Cancer Center only)    Standing Status:   Future    Number of Occurrences:   1    Expiration Date:   11/29/2024   Lactate dehydrogenase    Standing Status:   Future    Number of Occurrences:   1    Expiration Date:   11/29/2024   Vitamin B12    Standing Status:   Future    Number of Occurrences:   1    Expiration Date:   11/29/2024   Folate    Standing Status:   Future    Number of Occurrences:   1    Expiration Date:   11/29/2024   Reticulocytes    Standing Status:   Future    Number of Occurrences:   1    Expiration Date:   11/29/2024   Methylmalonic acid, serum    Standing Status:   Future    Number of Occurrences:   1    Expiration Date:   11/29/2024   Iron and Iron Binding Capacity ([facility] only)    Standing Status:   Future    Number of Occurrences:   1     Expiration Date:   11/29/2024   Ferritin    Standing Status:   Future    Number of Occurrences:   1    Expiration Date:   11/29/2024   Copper, serum    Standing Status:   Future    Number of Occurrences:   1    Expiration Date:   11/29/2024   TSH    Standing Status:   Future    Number of Occurrences:   1    Expiration Date:   11/29/2024   ANA w/Reflex if Positive    Standing Status:   Future    Number of Occurrences:   1    Expiration Date:   11/29/2024    Future Appointments  Date Time Provider Department Center  01/10/2024  8:15 AM [facility] None  01/10/2024  8:45 AM Foye Damron, Archie Patten, MD [facility] None  05/19/2024  8:30 AM Sater, Pearletha Furl, MD [facility] None    I spent a total of 55 minutes during this encounter with the patient including review of chart and various tests results, discussions about plan of care and coordination of care plan.  This document was completed utilizing speech recognition software. Grammatical errors, random word insertions, pronoun errors, and incomplete sentences are an occasional consequence of this system due to software limitations, ambient noise, and hardware issues. Any formal questions or concerns about the content, text or information contained within the body of this dictation should be directly addressed to the provider for clarification.

## 2023-12-01 LAB — ANA W/REFLEX IF POSITIVE: Anti Nuclear Antibody (ANA): NEGATIVE

## 2023-12-02 LAB — COPPER, SERUM: Copper: 79 ug/dL (ref 63–121)

## 2023-12-03 ENCOUNTER — Encounter: Payer: Self-pay | Admitting: Oncology

## 2023-12-04 ENCOUNTER — Encounter: Payer: Self-pay | Admitting: Oncology

## 2023-12-04 ENCOUNTER — Telehealth: Payer: Self-pay | Admitting: Oncology

## 2023-12-04 ENCOUNTER — Other Ambulatory Visit: Payer: Self-pay

## 2023-12-04 ENCOUNTER — Telehealth: Payer: Self-pay

## 2023-12-04 DIAGNOSIS — D702 Other drug-induced agranulocytosis: Secondary | ICD-10-CM

## 2023-12-04 DIAGNOSIS — G35 Multiple sclerosis: Secondary | ICD-10-CM

## 2023-12-04 NOTE — Telephone Encounter (Signed)
 Returned patient's voicemail message inquiring about pro-authorization for Zarxio Injections. During the call, patient was updated that this can take up to two weeks with [employer].He was made aware that this request was submitted last Friday and called the following Monday (yesterday) to inquire from [facility] as to why it was taking so long. Patient encouraged to give his insurance company more time to process the request. He said he would call his insurance company again today. He is considering paying for the injections out of his own pocket and then submitting the paperwork to his insurance company for reimbursement. Pt made aware that should this authorization not be approved, he may not get reimbursed.Patient is trying to push to get this done faster because he has an impending child to be born in next 2-3 wks.

## 2023-12-04 NOTE — Telephone Encounter (Signed)
 Patient called to schedule appointment.

## 2023-12-04 NOTE — Telephone Encounter (Signed)
 Left detailed message of appt details. Informed patient to call back if appointments do not work.

## 2023-12-05 ENCOUNTER — Inpatient Hospital Stay

## 2023-12-05 ENCOUNTER — Telehealth: Payer: Self-pay | Admitting: Oncology

## 2023-12-05 ENCOUNTER — Encounter: Payer: Self-pay | Admitting: Oncology

## 2023-12-05 VITALS — BP 147/78 | HR 95 | Temp 98.2°F | Resp 16

## 2023-12-05 DIAGNOSIS — D702 Other drug-induced agranulocytosis: Secondary | ICD-10-CM

## 2023-12-05 LAB — METHYLMALONIC ACID, SERUM: Methylmalonic Acid, Quantitative: 73 nmol/L (ref 0–378)

## 2023-12-05 MED ORDER — FILGRASTIM-SNDZ 300 MCG/0.5ML IJ SOSY
300.0000 ug | PREFILLED_SYRINGE | Freq: Once | INTRAMUSCULAR | Status: AC
  Administered 2023-12-05: 300 ug via SUBCUTANEOUS
  Filled 2023-12-05: qty 0.5

## 2023-12-05 NOTE — Telephone Encounter (Signed)
 Patient is aware of added lab appt.

## 2023-12-05 NOTE — Patient Instructions (Signed)
 Filgrastim Injection What is this medication? FILGRASTIM (fil GRA stim) lowers the risk of infection in people who are receiving chemotherapy. It works by Systems analyst make more white blood cells, which protects your body from infection. It may also be used to help people who have been exposed to high doses of radiation. It can be used to help prepare your body before a stem cell transplant. It works by helping your bone marrow make and release stem cells into the blood. This medicine may be used for other purposes; ask your health care provider or pharmacist if you have questions. COMMON BRAND NAME(S): Neupogen, Nivestym, Nypozi, Releuko, Zarxio What should I tell my care team before I take this medication? They need to know if you have any of these conditions: History of blood diseases, such as sickle cell anemia Kidney disease Recent or ongoing radiation An unusual or allergic reaction to filgrastim, pegfilgrastim, latex, rubber, other medications, foods, dyes, or preservatives Pregnant or trying to get pregnant Breast-feeding How should I use this medication? This medication is injected under the skin or into a vein. It is usually given by your care team in a hospital or clinic setting. It may be given at home. If you get this medication at home, you will be taught how to prepare and give it. Use exactly as directed. Take it as directed on the prescription label at the same time every day. Keep taking it unless your care team tells you to stop. It is important that you put your used needles and syringes in a special sharps container. Do not put them in a trash can. If you do not have a sharps container, call your pharmacist or care team to get one. This medication comes with INSTRUCTIONS FOR USE. Ask your pharmacist for directions on how to use this medication. Read the information carefully. Talk to your pharmacist or care team if you have questions. Talk to your care team about the use of  this medication in children. While it may be prescribed for children for selected conditions, precautions do apply. Overdosage: If you think you have taken too much of this medicine contact a poison control center or emergency room at once. NOTE: This medicine is only for you. Do not share this medicine with others. What if I miss a dose? It is important not to miss any doses. Talk to your care team about what to do if you miss a dose. What may interact with this medication? Medications that may cause a release of neutrophils, such as lithium This list may not describe all possible interactions. Give your health care provider a list of all the medicines, herbs, non-prescription drugs, or dietary supplements you use. Also tell them if you smoke, drink alcohol, or use illegal drugs. Some items may interact with your medicine. What should I watch for while using this medication? Your condition will be monitored carefully while you are receiving this medication. You may need bloodwork while taking this medication. Talk to your care team about your risk of cancer. You may be more at risk for certain types of cancer if you take this medication. What side effects may I notice from receiving this medication? Side effects that you should report to your care team as soon as possible: Allergic reactions--skin rash, itching, hives, swelling of the face, lips, tongue, or throat Capillary leak syndrome--stomach or muscle pain, unusual weakness or fatigue, feeling faint or lightheaded, decrease in the amount of urine, swelling of the ankles, hands,  or feet, trouble breathing High white blood cell level--fever, fatigue, trouble breathing, night sweats, change in vision, weight loss Inflammation of the aorta--fever, fatigue, back, chest, or stomach pain, severe headache Kidney injury (glomerulonephritis)--decrease in the amount of urine, red or dark brown urine, foamy or bubbly urine, swelling of the ankles, hands,  or feet Shortness of breath or trouble breathing Spleen injury--pain in upper left stomach or shoulder Unusual bruising or bleeding Side effects that usually do not require medical attention (report to your care team if they continue or are bothersome): Back pain Bone pain Fatigue Fever Headache Nausea This list may not describe all possible side effects. Call your doctor for medical advice about side effects. You may report side effects to FDA at 1-800-FDA-1088. Where should I keep my medication? Keep out of the reach of children and pets. Keep this medication in the original packaging until you are ready to take it. Protect from light. See product for storage information. Each product may have different instructions. Get rid of any unused medication after the expiration date. To get rid of medications that are no longer needed or have expired: Take the medication to a medications take-back program. Check with your pharmacy or law enforcement to find a location. If you cannot return the medication, ask your pharmacist or care team how to get rid of this medication safely. NOTE: This sheet is a summary. It may not cover all possible information. If you have questions about this medicine, talk to your doctor, pharmacist, or health care provider.  2024 Elsevier/Gold Standard (2022-01-19 00:00:00)

## 2023-12-06 ENCOUNTER — Inpatient Hospital Stay

## 2023-12-06 VITALS — BP 137/91 | HR 91 | Temp 98.7°F | Resp 16

## 2023-12-06 DIAGNOSIS — D702 Other drug-induced agranulocytosis: Secondary | ICD-10-CM

## 2023-12-06 MED ORDER — FILGRASTIM-SNDZ 300 MCG/0.5ML IJ SOSY
300.0000 ug | PREFILLED_SYRINGE | Freq: Once | INTRAMUSCULAR | Status: AC
  Administered 2023-12-06: 300 ug via SUBCUTANEOUS
  Filled 2023-12-06: qty 0.5

## 2023-12-07 ENCOUNTER — Inpatient Hospital Stay

## 2023-12-07 ENCOUNTER — Other Ambulatory Visit: Payer: Self-pay | Admitting: Oncology

## 2023-12-07 VITALS — BP 136/73 | HR 86 | Temp 98.4°F | Resp 16

## 2023-12-07 DIAGNOSIS — D702 Other drug-induced agranulocytosis: Secondary | ICD-10-CM

## 2023-12-07 DIAGNOSIS — G35 Multiple sclerosis: Secondary | ICD-10-CM

## 2023-12-07 LAB — CBC WITH DIFFERENTIAL (CANCER CENTER ONLY)
Abs Immature Granulocytes: 0.54 10*3/uL — ABNORMAL HIGH (ref 0.00–0.07)
Basophils Absolute: 0.1 10*3/uL (ref 0.0–0.1)
Basophils Relative: 0 %
Eosinophils Absolute: 0.2 10*3/uL (ref 0.0–0.5)
Eosinophils Relative: 1 %
HCT: 47.2 % (ref 39.0–52.0)
Hemoglobin: 15.8 g/dL (ref 13.0–17.0)
Immature Granulocytes: 2 %
Lymphocytes Relative: 4 %
Lymphs Abs: 1.2 10*3/uL (ref 0.7–4.0)
MCH: 28.8 pg (ref 26.0–34.0)
MCHC: 33.5 g/dL (ref 30.0–36.0)
MCV: 86 fL (ref 80.0–100.0)
Monocytes Absolute: 0.8 10*3/uL (ref 0.1–1.0)
Monocytes Relative: 3 %
Neutro Abs: 24.4 10*3/uL — ABNORMAL HIGH (ref 1.7–7.7)
Neutrophils Relative %: 90 %
Platelet Count: 163 10*3/uL (ref 150–400)
RBC: 5.49 MIL/uL (ref 4.22–5.81)
RDW: 12.9 % (ref 11.5–15.5)
WBC Count: 27.2 10*3/uL — ABNORMAL HIGH (ref 4.0–10.5)
nRBC: 0 % (ref 0.0–0.2)

## 2023-12-07 MED ORDER — FILGRASTIM-SNDZ 300 MCG/0.5ML IJ SOSY
300.0000 ug | PREFILLED_SYRINGE | Freq: Once | INTRAMUSCULAR | Status: AC
  Administered 2023-12-07: 300 ug via SUBCUTANEOUS
  Filled 2023-12-07: qty 0.5

## 2023-12-08 ENCOUNTER — Telehealth: Payer: Self-pay | Admitting: Neurology

## 2023-12-08 MED ORDER — NYSTATIN 100000 UNIT/ML MT SUSP
OROMUCOSAL | 0 refills | Status: AC
Start: 2023-12-08 — End: ?

## 2023-12-08 NOTE — Telephone Encounter (Signed)
 Albert Edwards called the on call physician after noticing white spots on his tongue. His underlying condition is MS, he developed  Leucopenia and is on a white cell booster Zarxio. This medication is currently not listed in his med list.  Hem -Onco was contacted first but referred back to Neurology, thinking it is likely thrush. I think so too and will send a script for Magic mouthwash to [facility] [place] [facility] [facility]   [facility] , [phone].   I was advised that compound mouthwash would be an out of pocket cost to the patient , so [facility] and [facility] in only liquid Nystatin that the patient will mix 1:1 with OTC Mylanta. 5 ml each , total 1 teaspoon  swish and swallow after each meal.   Cc Dr Epimenio Foot    Albert Novas, MD

## 2023-12-09 ENCOUNTER — Encounter: Payer: Self-pay | Admitting: Neurology

## 2023-12-11 ENCOUNTER — Telehealth: Payer: Self-pay

## 2023-12-11 NOTE — Telephone Encounter (Signed)
 Returned phone call and spoke this patient this morning to follow up on a message he left with complaints of film on his tongue after treatment. He started to to have pain on tongue and throat with the white areas. Currently on Cipro when symptoms started. Patient reported that he has an RX now for [employer] to tx Thrush that he obtained from his neurologist. Patient reported he is responding well to tx and the symptoms are diminishing.

## 2024-01-03 ENCOUNTER — Encounter: Payer: Self-pay | Admitting: Neurology

## 2024-01-10 ENCOUNTER — Other Ambulatory Visit

## 2024-01-10 ENCOUNTER — Inpatient Hospital Stay: Attending: Oncology | Admitting: Oncology

## 2024-01-10 ENCOUNTER — Inpatient Hospital Stay

## 2024-01-10 VITALS — BP 132/79 | HR 78 | Temp 98.3°F | Resp 16 | Ht 71.0 in | Wt 146.4 lb

## 2024-01-10 DIAGNOSIS — Z79620 Long term (current) use of immunosuppressive biologic: Secondary | ICD-10-CM | POA: Diagnosis not present

## 2024-01-10 DIAGNOSIS — D702 Other drug-induced agranulocytosis: Secondary | ICD-10-CM

## 2024-01-10 DIAGNOSIS — G35 Multiple sclerosis: Secondary | ICD-10-CM | POA: Insufficient documentation

## 2024-01-10 LAB — CBC WITH DIFFERENTIAL (CANCER CENTER ONLY)
Abs Immature Granulocytes: 0.01 10*3/uL (ref 0.00–0.07)
Basophils Absolute: 0 10*3/uL (ref 0.0–0.1)
Basophils Relative: 0 %
Eosinophils Absolute: 0.1 10*3/uL (ref 0.0–0.5)
Eosinophils Relative: 2 %
HCT: 47.8 % (ref 39.0–52.0)
Hemoglobin: 16.2 g/dL (ref 13.0–17.0)
Immature Granulocytes: 0 %
Lymphocytes Relative: 13 %
Lymphs Abs: 0.9 10*3/uL (ref 0.7–4.0)
MCH: 28.4 pg (ref 26.0–34.0)
MCHC: 33.9 g/dL (ref 30.0–36.0)
MCV: 83.9 fL (ref 80.0–100.0)
Monocytes Absolute: 0.8 10*3/uL (ref 0.1–1.0)
Monocytes Relative: 11 %
Neutro Abs: 5.2 10*3/uL (ref 1.7–7.7)
Neutrophils Relative %: 74 %
Platelet Count: 181 10*3/uL (ref 150–400)
RBC: 5.7 MIL/uL (ref 4.22–5.81)
RDW: 13.1 % (ref 11.5–15.5)
WBC Count: 7.1 10*3/uL (ref 4.0–10.5)
nRBC: 0 % (ref 0.0–0.2)

## 2024-01-10 LAB — IRON AND IRON BINDING CAPACITY (CC-WL,HP ONLY)
Iron: 105 ug/dL (ref 45–182)
Saturation Ratios: 29 % (ref 17.9–39.5)
TIBC: 357 ug/dL (ref 250–450)
UIBC: 252 ug/dL (ref 117–376)

## 2024-01-10 LAB — FERRITIN: Ferritin: 156 ng/mL (ref 24–336)

## 2024-01-10 NOTE — Progress Notes (Signed)
 [facility]  HEMATOLOGY CLINIC PROGRESS NOTE  PATIENT NAME: [facility]   MR#: [id number] DOB: 11/09/1995  Patient Care Team: Rachele Buddy, DO as PCP - General (Family Medicine) Godwin Lat, Sherida Dimmer, MD (Neurology)  Date of visit: 01/10/2024   ASSESSMENT & PLAN:   Albert Edwards is a 28 y.o. gentleman with a past medical history of multiple sclerosis currently receiving ocrelizumab, irritable bowel syndrome, was referred to our service in March 2025 for evaluation of leukopenia.     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 Neutrophil counts have been fluctuating since January 2024, with the most recent count critically low at 100 on 11/27/2023.   He has experienced recurrent infections and canker sores in the past, likely related to the low neutrophil count. The risk of infection is high when neutrophils are below 500.   On his initial consultation with us  on 11/30/2023, labs showed white count of 2100 with ANC of 300.  Remaining blood counts were all within normal limits.  Iron studies showed no evidence of iron deficiency.  Medication-induced neutropenia is suspected, but further tests were conducted to rule out other causes such as vitamin deficiencies or autoimmune conditions.     I started him empirically on ciprofloxacin  500 mg p.o. twice daily for neutropenic prophylaxis.   B12, folic acid , copper , as well as autoimmune markers were checked and they were all unremarkable.   Most likely etiology for his neutropenia is Ocrevus.  On literature review, late onset neutropenia from ocrelizumab could have been anywhere between 40 days from last infusion, up until 330 days.  He was treated with Zarxio  300 mcg daily for 3 doses with excellent response.   If persistent neutropenia is noted, alternative medication may have to be planned for him and we will defer this to  his neurologist.   If prolonged neutropenia is noted, he may need antiviral and antifungal prophylaxis as well.  Patient would like to establish with a hematologist locally in [place] to reduce travel.  He will reach out to us  in case of any need for assistance.  We will forward our records to his hematologist, once he establishes.      I spent a total of 20 minutes during this encounter with the patient including review of chart and various tests results, discussions about plan of care and coordination of care plan.  I reviewed lab results and outside records for this visit and discussed relevant results with the patient. Diagnosis, plan of care and treatment options were also discussed in detail with the patient. Opportunity provided to ask questions and answers provided to his apparent satisfaction. Provided instructions to call our clinic with any problems, questions or concerns prior to return visit. I recommended to continue follow-up with PCP and sub-specialists. He verbalized understanding and agreed with the plan. No barriers to learning was detected.  Albert Berber, MD  01/10/2024 8:59 AM  [facility] - A DEPT OF [facility] [address] [place] [address] Dept: [phone] Dept Fax: [phone]   CHIEF COMPLAINT/ REASON FOR VISIT:  Follow-up for medication induced neutropenia secondary to Ocrevus  INTERVAL HISTORY:  Discussed the use of AI scribe software for clinical note transcription with the patient, who gave verbal consent to proceed.  History of Present Illness Albert [facility] is a 28 year old male who presents for follow-up after receiving  three injections.  His white blood cell count, previously elevated due to the injections, is now normal at 7,100. Hemoglobin is 16.2, and platelet count is 181,000. Neutrophils are at 5,200, the highest it has been in a long time.  Previously, his iron  saturation was slightly low at 14%, but it has been rechecked. Other tests, including B12, copper , and ANA, were normal, and an autoimmune workup was negative. He is currently on Ocrevus, which he receives every six months, with the next infusion scheduled for June.  He lives in [place] and is considering seeing a hematologist there to minimize travel.  He feels 'a little sick' but does not have a fever.  SUMMARY OF HEMATOLOGIC HISTORY:  On 11/27/2023, labs at his neurologist office showed white count of 2100, ANC of 100, ALC of 1200.  Hemoglobin 15.7, platelet count 200,000.  Hence a referral was sent to us  for further evaluation of neutropenia and leukopenia.   On review of records, he had leukopenia with white count of 2600 on 10/19/2023, ANC 700 at that time.  He also had leukopenia in January 2024 when the white count was 2100, ANC of 1200.   He has a history of Multiple Sclerosis (MS) managed with Ocrevus since diagnosis in September 2021, presents with concerns about a recent finding of neutropenia. The patient reports frequent illnesses and canker sores, which have been significant enough at times to interfere with eating and speaking. He also mentions a prolonged cough lasting about four weeks from late January through February. The patient had a bout of cellulitis in September 2024, which was resolved with antibiotics. He also reports occasional red bumps on the chest and back, which do not cause discomfort and resolve on their own. The patient's MS has been stable with no new symptoms or changes in imaging. He also takes vitamin B12 and D3 supplements daily.   He denies fever, cough, diarrhea, or other infectious symptoms.  He also denies unintentional weight loss, night sweats or other constitutional symptoms.  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 Neutrophil counts have been fluctuating since January 2024, with the most recent count  critically low at 100 on 11/27/2023.    He has experienced recurrent infections and canker sores in the past, likely related to the low neutrophil count. The risk of infection is high when neutrophils are below 500.    On his initial consultation with us  on 11/30/2023, labs showed white count of 2100 with ANC of 300.  Remaining blood counts were all within normal limits.  Iron studies showed no evidence of iron deficiency.  Medication-induced neutropenia is suspected, but further tests were conducted to rule out other causes such as vitamin deficiencies or autoimmune conditions.     I started him empirically on ciprofloxacin  500 mg p.o. twice daily for neutropenic prophylaxis.   B12, folic acid , copper , as well as autoimmune markers were checked and they were all unremarkable.   Most likely etiology for his neutropenia is Ocrevus.  On literature review, late onset neutropenia from ocrelizumab could have been anywhere between 40 days from last infusion, up until 330 days.  He was treated with Zarxio  300 mcg daily for 3 doses with excellent response.   If persistent neutropenia is noted, alternative medication may have to be planned for him and we will defer this to his neurologist.   If prolonged neutropenia is noted, he may need antiviral and antifungal prophylaxis as well.  I have reviewed the past  medical history, past surgical history, social history and family history with the patient and they are unchanged from previous note.  ALLERGIES: He has no known allergies.  MEDICATIONS:  Current Outpatient Medications  Medication Sig Dispense Refill   Cholecalciferol  (VITAMIN D -1000 MAX ST) 25 MCG (1000 UT) tablet Take 2,000 Units by mouth daily.     ocrelizumab (OCREVUS) 300 MG/10ML injection Inject into the vein every 6 (six) months. Initial dose: 300mg  IV on day 1; repeat on day 15 Maintenance dose: 600mg  IV q 6 months     vitamin B-12 (CYANOCOBALAMIN ) 1000 MCG tablet Take 1 tablet (1,000  mcg total) by mouth daily. 90 tablet 3   gabapentin  (NEURONTIN ) 300 MG capsule Take 1 capsule (300 mg total) by mouth 3 (three) times daily. (Patient not taking: Reported on 01/10/2024) 90 capsule 5   nystatin  (MYCOSTATIN ) 100000 UNIT/ML suspension Take 5 ml liquid nystatin  and mix with 5 ml liquid Mylanta to swish and swallow after each meal and at bedtime ( 4 times daily) until candidiasis resolves. (Patient not taking: Reported on 01/10/2024) 473 mL 0   triamcinolone  cream (KENALOG ) 0.1 % Apply 1 Application topically 2 (two) times daily. For 7-10 days maximum. Cover with vaseline or eucerin after use. (Patient not taking: Reported on 01/10/2024) 80 g 0   No current facility-administered medications for this visit.     REVIEW OF SYSTEMS:    Review of Systems - Oncology  All other pertinent systems were reviewed with the patient and are negative.  PHYSICAL EXAMINATION:   Onc Performance Status - 01/10/24 0853       ECOG Perf Status   ECOG Perf Status Restricted in physically strenuous activity but ambulatory and able to carry out work of a light or sedentary nature, e.g., light house work, office work      KPS SCALE   KPS % SCORE Able to carry on normal activity, minor s/s of disease             Vitals:   01/10/24 0841 01/10/24 0843  BP: (!) 144/79 132/79  Pulse: 78   Resp: 16   Temp: 98.3 F (36.8 C)   SpO2: 100%    Filed Weights   01/10/24 0841  Weight: 146 lb 7 oz (66.4 kg)    Physical Exam Constitutional:      General: He is not in acute distress.    Appearance: Normal appearance.  HENT:     Head: Normocephalic and atraumatic.  Eyes:     General: No scleral icterus.    Conjunctiva/sclera: Conjunctivae normal.  Cardiovascular:     Rate and Rhythm: Normal rate and regular rhythm.     Heart sounds: Normal heart sounds.  Pulmonary:     Effort: Pulmonary effort is normal.     Breath sounds: Normal breath sounds.  Abdominal:     General: There is no distension.   Musculoskeletal:     Right lower leg: No edema.     Left lower leg: No edema.  Neurological:     General: No focal deficit present.     Mental Status: He is alert and oriented to person, place, and time.  Psychiatric:        Mood and Affect: Mood normal.        Behavior: Behavior normal.        Thought Content: Thought content normal.     LABORATORY DATA:   I have reviewed the data as listed.  Results for orders placed or performed  in visit on 01/10/24  CBC with Differential (Cancer Center Only)  Result Value Ref Range   WBC Count 7.1 4.0 - 10.5 K/uL   RBC 5.70 4.22 - 5.81 MIL/uL   Hemoglobin 16.2 13.0 - 17.0 g/dL   HCT 16.1 09.6 - 04.5 %   MCV 83.9 80.0 - 100.0 fL   MCH 28.4 26.0 - 34.0 pg   MCHC 33.9 30.0 - 36.0 g/dL   RDW 40.9 81.1 - 91.4 %   Platelet Count 181 150 - 400 K/uL   nRBC 0.0 0.0 - 0.2 %   Neutrophils Relative % 74 %   Neutro Abs 5.2 1.7 - 7.7 K/uL   Lymphocytes Relative 13 %   Lymphs Abs 0.9 0.7 - 4.0 K/uL   Monocytes Relative 11 %   Monocytes Absolute 0.8 0.1 - 1.0 K/uL   Eosinophils Relative 2 %   Eosinophils Absolute 0.1 0.0 - 0.5 K/uL   Basophils Relative 0 %   Basophils Absolute 0.0 0.0 - 0.1 K/uL   Immature Granulocytes 0 %   Abs Immature Granulocytes 0.01 0.00 - 0.07 K/uL     No orders of the defined types were placed in this encounter.    Future Appointments  Date Time Provider Department Center  05/19/2024  8:30 AM Sater, Sherida Dimmer, MD [facility] None     This document was completed utilizing speech recognition software. Grammatical errors, random word insertions, pronoun errors, and incomplete sentences are an occasional consequence of this system due to software limitations, ambient noise, and hardware issues. Any formal questions or concerns about the content, text or information contained within the body of this dictation should be directly addressed to the provider for clarification.

## 2024-01-11 ENCOUNTER — Encounter: Payer: Self-pay | Admitting: Oncology

## 2024-01-11 NOTE — Assessment & Plan Note (Addendum)
 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 Neutrophil counts have been fluctuating since January 2024, with the most recent count critically low at 100 on 11/27/2023.   He has experienced recurrent infections and canker sores in the past, likely related to the low neutrophil count. The risk of infection is high when neutrophils are below 500.   On his initial consultation with us  on 11/30/2023, labs showed white count of 2100 with ANC of 300.  Remaining blood counts were all within normal limits.  Iron studies showed no evidence of iron deficiency.  Medication-induced neutropenia is suspected, but further tests were conducted to rule out other causes such as vitamin deficiencies or autoimmune conditions.     I started him empirically on ciprofloxacin  500 mg p.o. twice daily for neutropenic prophylaxis.   B12, folic acid , copper , as well as autoimmune markers were checked and they were all unremarkable.   Most likely etiology for his neutropenia is Ocrevus.  On literature review, late onset neutropenia from ocrelizumab could have been anywhere between 40 days from last infusion, up until 330 days.  He was treated with Zarxio  300 mcg daily for 3 doses with excellent response.   If persistent neutropenia is noted, alternative medication may have to be planned for him and we will defer this to his neurologist.   If prolonged neutropenia is noted, he may need antiviral and antifungal prophylaxis as well.  Patient would like to establish with a hematologist locally in [place] to reduce travel.  He will reach out to us  in case of any need for assistance.  We will forward our records to his hematologist, once he establishes.

## 2024-01-26 ENCOUNTER — Encounter: Payer: Self-pay | Admitting: Neurology

## 2024-02-18 ENCOUNTER — Encounter: Payer: Self-pay | Admitting: Neurology

## 2024-02-18 ENCOUNTER — Other Ambulatory Visit: Payer: Self-pay

## 2024-02-18 DIAGNOSIS — G35 Multiple sclerosis: Secondary | ICD-10-CM

## 2024-02-19 ENCOUNTER — Other Ambulatory Visit: Payer: Self-pay | Admitting: Neurology

## 2024-02-19 DIAGNOSIS — G35 Multiple sclerosis: Secondary | ICD-10-CM

## 2024-02-19 DIAGNOSIS — D702 Other drug-induced agranulocytosis: Secondary | ICD-10-CM

## 2024-02-20 ENCOUNTER — Telehealth: Payer: Self-pay | Admitting: Neurology

## 2024-02-20 NOTE — Telephone Encounter (Signed)
 Referral for Hematology/Oncology fax to [facility]. Phone:[phone], Fax: [phone].

## 2024-02-28 ENCOUNTER — Telehealth: Payer: Self-pay | Admitting: *Deleted

## 2024-02-28 NOTE — Telephone Encounter (Signed)
 Provided approval letter to [facility] since they infuse patient

## 2024-04-01 ENCOUNTER — Ambulatory Visit: Payer: Self-pay | Admitting: Neurology

## 2024-04-01 LAB — CBC WITH DIFFERENTIAL/PLATELET
Basophils Absolute: 0 x10E3/uL (ref 0.0–0.2)
Basos: 0 %
EOS (ABSOLUTE): 0.2 x10E3/uL (ref 0.0–0.4)
Eos: 3 %
Hematocrit: 48 % (ref 37.5–51.0)
Hemoglobin: 15.8 g/dL (ref 13.0–17.7)
Immature Grans (Abs): 0 x10E3/uL (ref 0.0–0.1)
Immature Granulocytes: 0 %
Lymphocytes Absolute: 1.4 x10E3/uL (ref 0.7–3.1)
Lymphs: 25 %
MCH: 29.6 pg (ref 26.6–33.0)
MCHC: 32.9 g/dL (ref 31.5–35.7)
MCV: 90 fL (ref 79–97)
Monocytes Absolute: 0.6 x10E3/uL (ref 0.1–0.9)
Monocytes: 10 %
Neutrophils Absolute: 3.5 x10E3/uL (ref 1.4–7.0)
Neutrophils: 62 %
Platelets: 182 x10E3/uL (ref 150–450)
RBC: 5.34 x10E6/uL (ref 4.14–5.80)
RDW: 14.1 % (ref 11.6–15.4)
WBC: 5.7 x10E3/uL (ref 3.4–10.8)

## 2024-05-11 ENCOUNTER — Encounter: Payer: Self-pay | Admitting: Neurology

## 2024-05-19 ENCOUNTER — Encounter: Payer: Self-pay | Admitting: Neurology

## 2024-05-19 ENCOUNTER — Ambulatory Visit (INDEPENDENT_AMBULATORY_CARE_PROVIDER_SITE_OTHER): Payer: Commercial Managed Care - PPO | Admitting: Neurology

## 2024-05-19 ENCOUNTER — Encounter: Payer: Self-pay | Admitting: Oncology

## 2024-05-19 VITALS — BP 150/78 | HR 98 | Ht 71.0 in | Wt 153.5 lb

## 2024-05-19 DIAGNOSIS — R202 Paresthesia of skin: Secondary | ICD-10-CM

## 2024-05-19 DIAGNOSIS — G35 Multiple sclerosis: Secondary | ICD-10-CM | POA: Diagnosis not present

## 2024-05-19 DIAGNOSIS — Z733 Stress, not elsewhere classified: Secondary | ICD-10-CM | POA: Insufficient documentation

## 2024-05-19 DIAGNOSIS — R1012 Left upper quadrant pain: Secondary | ICD-10-CM | POA: Insufficient documentation

## 2024-05-19 DIAGNOSIS — H469 Unspecified optic neuritis: Secondary | ICD-10-CM

## 2024-05-19 DIAGNOSIS — Z79899 Other long term (current) drug therapy: Secondary | ICD-10-CM

## 2024-05-19 DIAGNOSIS — R140 Abdominal distension (gaseous): Secondary | ICD-10-CM | POA: Insufficient documentation

## 2024-05-19 DIAGNOSIS — D702 Other drug-induced agranulocytosis: Secondary | ICD-10-CM | POA: Diagnosis not present

## 2024-05-19 DIAGNOSIS — G35A Relapsing-remitting multiple sclerosis: Secondary | ICD-10-CM

## 2024-05-19 MED ORDER — GABAPENTIN 300 MG PO CAPS: 300.0000 mg | ORAL_CAPSULE | Freq: Three times a day (TID) | ORAL | 3 refills | Status: DC

## 2024-05-19 NOTE — Progress Notes (Addendum)
 [facility]  PATIENT: Albert Edwards DOB: 09/06/1996  REFERRING DOCTOR OR PCP:  Garnette Lukes / Aisha Seals SOURCE: Patient, notes from hospitalization and PCP, imaging laboratory reports, MRI images personally reviewed and interpreted.  _________________________________   HISTORICAL  CHIEF COMPLAINT:  Chief Complaint  Patient presents with   RM10/MS    Pt is here Alone. Pt states that things have been going well since his last appointment. Pt states he still has blurry vision. Pt states that he has a lot of fatigue. Pt states that he has some stumbles. Pt states he has heart palpitations.     HISTORY OF PRESENT ILLNESS:  Albert Edwards is a 28 y.o. man with relapsing remitting multiple sclerosis.  Update 05/19/2024 His MS is stable.   He started Ocrevus November 2021 and had his last infusion December 2024.     He has not had any exacerbaions or new neurologic symptoms.    He had moderate infusion reactions first dose but nothing significant last time.   He has had some heartburn and takes Prilosec,     His last CBC/D was normal 3.5 in 7/2025He continues to take chlorine dioxide ([facility]) preventative mouthwash and he reports no further ulcers.  His father is a education officer, community.  .No more episodes of cellulitis.    No fevers.   Gait and balance are doing well.   He runs fine and for long distance..   Rare stumbles  and no falls.   He often  holds the bannister, especially if he holds the  baby on the stairs.    Strength and sensation are doing well.   He has not had a recent Lhermitte sign.   Blurry vision improved but still bothers him at time,often when taking a shower or very tired on the right eye only.   He has mild urinary frequency but no incontinence.   He has nocturia x 1-2.     He has some burning chest discomfort left side mostly not associated with exertion or exercise.  He also has palpitations.  Feels burning at 2/10 intensity.  Not much  tightness.   This is worse with stress.      EKG and CXR were reportedly normal.    He does not have too much fatigue most days.SABRA   He sleeps well.   He does everything he needs to and wants to do.   Mood is fine.   He has word finding difficulty but not enough to cause issues.   He feels STM is a little reduced.    He did an Engineer, Agricultural at [employer]).  He is working at [employer] in [place].     He has a son born March 2023. And daughter due April 2025.     He takes Vit D and B12 daily.       MS Histroy:  He had the onset of vertigo in August 2021 and improved some with meclizine .   Shortly thereafter, he noted blurry vision while running in early September.  The visual symptoms persisted he had an MRI of the brain performed 05/28/2020 which showed multiple foci c/w MS, some that enhanced.    He had noted minimal balance issues off/on over the last year but was going up and down stairs without issues and carried on all his normal activities.   He had no numbness, tingling or weakness.   He had no bladder vision.    After the abnormal brain MRI, he was  admitted to [facility]/29/19 and received 3 days of IV Solu-Medrol .  Additionally, he had MRI of the cervical thoracic spine showing additional lesions including 1 enhancing lesion to the right adjacent to C6.  He tolerated the steroid well and felt some improvement of symptoms.  He has not had any new symptoms since that time.  I saw him shortly after discharge and treatment options were discussed.  He opted to begin Ocrevus.  His first dose was October 2021  No FH of MS,   His father had B12 deficiency and needed treatment.    He is otherwise in good health.      MRI images/Labs MRI brain 05/28/2020 shows multiple T2/FLAIR hyperintense foci.  Foci are present in the left middle cerebellar peduncle, cerebellar vermis, and in the periventricular, juxtacortical deep white matter of the hemispheres.  Several foci enhance after contrast.  The largest is  in the posterior left frontal lobe.  There are also a few punctate enhancing foci in the hemispheres including a periventricular enhancing focus on the right and a juxtacortical focus in the left parietal lobe.  MRI of the cervical spine 05/30/2020 showed an enhancing focus adjacent to C6 another T2 hyperintense foci that are chronic.  These are located anteriorly adjacent to C3-C4, posterior laterally to the right adjacent to C4, posterior laterally to the left adjacent to C5, anteriorly adjacent to C5, laterally to the right adjacent to C6 (enhancing consistent with acute focus), posterior laterally to the left adjacent to C7, centrally adjacent to T1,  MRI of the thoracic spine 05/30/2020 shows several T2 hyperintense foci consistent with chronic demyelinating plaque.  No enhancing lesions.   These are located centrally adjacent to T1, posterolaterally to the left adjacent to T9,  MRI of the brain 02/26/2021 shows T2/flair hyperintense lesions in the left middle cerebellar peduncle and in the periventricular, juxtacortical and deep white matter of both hemispheres.  None of the foci enhance.  Compared to the MRI from 05/28/2020, there do not appear to be any new lesions.  The foci that enhanced in 2021 no longer do so  MRI of the cervical spine 02/26/2021 shows T2 hyperintense foci anteriorly at C3-C4, posterolaterally to the right at C4, posterolaterally to the left at C4 C5-C5, to the right at C5, to the right and the left at C5-C6, posterolaterally to the left at C6-C7 and possibly a focus to the right at that level, anterocentrally at T1.  None of the foci enhance.  Compared to the MRI from 05/30/2020 and compared side-by-side, there were no new lesions.  The focus that enhanced in 2021 no longer does  MRI of the brain and cervical spine 03/22/2022 showed no new lesions.  MRI of the brain 05/01/2023 was unchanged   Laboratory tests  05/28/2020 through 05/30/2020 show negative PCR test for SARS-CoV-2.   HIV negative.  SSA/SSB negative, Vitamin D  was 38.   REVIEW OF SYSTEMS: Constitutional: No fevers, chills, sweats, or change in appetite Eyes: No visual changes, double vision, eye pain Ear, nose and throat: No hearing loss, ear pain, nasal congestion, sore throat Cardiovascular: No chest pain, palpitations Respiratory:  No shortness of breath at rest or with exertion.   No wheezes GastrointestinaI: No nausea, vomiting, diarrhea, abdominal pain, fecal incontinence Genitourinary:  No dysuria, urinary retention or frequency.  No nocturia. Musculoskeletal:  No neck pain, back pain Integumentary: No rash, pruritus, skin lesions Neurological: as above Psychiatric: No depression at this time.  No anxiety Endocrine: No palpitations,  diaphoresis, change in appetite, change in weigh or increased thirst Hematologic/Lymphatic:  No anemia, purpura, petechiae. Allergic/Immunologic: No itchy/runny eyes, nasal congestion, recent allergic reactions, rashes  ALLERGIES: No Known Allergies  HOME MEDICATIONS:  Current Outpatient Medications:    Cholecalciferol  (VITAMIN D -1000 MAX ST) 25 MCG (1000 UT) tablet, Take 2,000 Units by mouth daily., Disp: , Rfl:    ocrelizumab (OCREVUS) 300 MG/10ML injection, Inject into the vein every 6 (six) months. Initial dose: 300mg  IV on day 1; repeat on day 15 Maintenance dose: 600mg  IV q 6 months, Disp: , Rfl:    vitamin B-12 (CYANOCOBALAMIN ) 1000 MCG tablet, Take 1 tablet (1,000 mcg total) by mouth daily., Disp: 90 tablet, Rfl: 3   gabapentin  (NEURONTIN ) 100 MG capsule, Take 3 capsules (300 mg total) by mouth 3 (three) times daily., Disp: 150 capsule, Rfl: 11   nystatin  (MYCOSTATIN ) 100000 UNIT/ML suspension, Take 5 ml liquid nystatin  and mix with 5 ml liquid Mylanta to swish and swallow after each meal and at bedtime ( 4 times daily) until candidiasis resolves. (Patient not taking: Reported on 05/19/2024), Disp: 473 mL, Rfl: 0   triamcinolone  cream (KENALOG ) 0.1 %, Apply 1  Application topically 2 (two) times daily. For 7-10 days maximum. Cover with vaseline or eucerin after use. (Patient not taking: Reported on 05/19/2024), Disp: 80 g, Rfl: 0  PAST MEDICAL HISTORY: Past Medical History:  Diagnosis Date   IBS (irritable colon syndrome)    Constipation predominant. Constipation- with BM abdominal pain improves. Comes in waves with higher stress. worse with less exercise. Plan: increase exercise, watch FODMAP, consider metamucil if that doesnt work   Multiple sclerosis     PAST SURGICAL HISTORY: Past Surgical History:  Procedure Laterality Date   ADENOIDECTOMY     2006   TONSILLECTOMY      FAMILY HISTORY: Family History  Problem Relation Age of Onset   Healthy Mother        and father   Other Father        Vit B12 def   ADD / ADHD Brother    Hyperlipidemia Other        grandparent   Hypertension Other        grandparent   Diabetes Other        grandparent   Hypothyroidism Other    Multiple sclerosis Neg Hx     SOCIAL HISTORY:  Social History   Socioeconomic History   Marital status: Married    Spouse name: courtney   Number of children: 1   Years of education: Not on file   Highest education level: Master's degree (e.g., MA, MS, MEng, MEd, MSW, MBA)  Occupational History   Not on file  Tobacco Use   Smoking status: Never    Passive exposure: Never   Smokeless tobacco: Never  Vaping Use   Vaping status: Never Used  Substance and Sexual Activity   Alcohol use: Yes    Alcohol/week: 1.0 standard drink of alcohol    Types: 1 Standard drinks or equivalent per week    Comment: 1-2 per week   Drug use: No   Sexual activity: Yes    Birth control/protection: None  Other Topics Concern   Not on file  Social History Narrative   Family: Marred. Son born November 20 2021. 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    Reading- for  Programmer, Multimedia 2020   HS- [employer]. High achieving student      Hobbies: exercise, video games on xbox and PC, switch, learning to play guitar         Social Drivers of Health   Financial Resource Strain: Low Risk  (09/19/2023)   Received from [facility]   Overall Financial Resource Strain (CARDIA)    Difficulty of Paying Living Expenses: Not hard at all  Food Insecurity: No Food Insecurity (11/30/2023)   Hunger Vital Sign    Worried About Running Out of Food in the Last Year: Never true    Ran Out of Food in the Last Year: Never true  Transportation Needs: No Transportation Needs (11/30/2023)   PRAPARE - Administrator, Civil Service (Medical): No    Lack of Transportation (Non-Medical): No  Physical Activity: Not on file  Stress: Not on file  Social Connections: Not on file  Intimate Partner Violence: Not At Risk (05/27/2024)   Received from [facility]   Humiliation, Afraid, Rape, and Kick questionnaire    Within the last year, have you been afraid of your partner or ex-partner?: No    Within the last year, have you been humiliated or emotionally abused in other ways by your partner or ex-partner?: No    Within the last year, have you been kicked, hit, slapped, or otherwise physically hurt by your partner or ex-partner?: No    Within the last year, have you been raped or forced to have any kind of sexual activity by your partner or ex-partner?: No     PHYSICAL EXAM  Vitals:   05/19/24 0825  BP: (!) 150/78  Pulse: 98  SpO2: 99%  Weight: 153 lb 8 oz (69.6 kg)  Height: 5' 11 (1.803 m)     Body mass index is 21.41 kg/m.   General: The patient is well-developed and well-nourished and in no acute distress  HEENT:  Head is [place]/AT.  Sclera are anicteric.    Sores on right lower lip and under tongue on left  Skin: Extremities are without rash or  edema.  Neurologic Exam  Mental status: The patient is alert and oriented x 3  at the time of the examination. The patient has apparent normal recent and remote memory, with an apparently normal attention span and concentration ability.   Speech is normal.  Cranial nerves: Extraocular movements are full.  Color vision was symmetric.  Facial strength is normal.  Trapezius and sternocleidomastoid strength is normal. No dysarthria is noted.  No obvious hearing deficits noted.  Motor:  Muscle bulk is normal.   Tone is normal. Strength is  5 / 5 in all 4 extremities.   Sensory: Sensory testing is intact to pinprick, soft touch and vibration sensation in all 4 extremities.  Coordination: Cerebellar testing reveals good finger-nose-finger and heel-to-shin bilaterally.  Gait and station: Station is normal.   His gait is normal.  The tandem gait is minimally wide..  Romberg is negative.  Reflexes: Deep tendon reflexes are symmetric but increased, 3 in the arms and 3+ at the knees with spread and no clonus.     DIAGNOSTIC DATA (LABS, IMAGING, TESTING) - I reviewed patient records, labs, notes, testing and imaging myself where available.  Lab Results  Component Value Date   WBC 4.4 05/19/2024   HGB 16.4 05/19/2024   HCT 49.8 05/19/2024   MCV 89 05/19/2024   PLT 199 05/19/2024  Component Value Date/Time   NA 142 11/30/2023 1529   K 3.9 11/30/2023 1529   CL 106 11/30/2023 1529   CO2 30 11/30/2023 1529   GLUCOSE 109 (H) 11/30/2023 1529   BUN 13 11/30/2023 1529   CREATININE 0.86 11/30/2023 1529   CALCIUM 9.3 11/30/2023 1529   PROT 7.5 11/30/2023 1529   ALBUMIN 5.2 (H) 11/30/2023 1529   AST 17 11/30/2023 1529   ALT 14 11/30/2023 1529   ALKPHOS 64 11/30/2023 1529   BILITOT 1.9 (H) 11/30/2023 1529   GFRNONAA >60 11/30/2023 1529   GFRAA >60 05/30/2020 0546   Lab Results  Component Value Date   CHOL 114 06/06/2019   HDL 44.20 06/06/2019   LDLCALC 59 06/06/2019   TRIG 50.0 06/06/2019   CHOLHDL 3 06/06/2019   No results found for: HGBA1C Lab Results   Component Value Date   VITAMINB12 910 11/30/2023   Lab Results  Component Value Date   TSH 1.079 11/30/2023       ASSESSMENT AND PLAN  Multiple sclerosis - Plan: IgG, IgA, IgM, CBC with Differential/Platelet, MR BRAIN W WO CONTRAST, MR CERVICAL SPINE W WO CONTRAST  Relapsing remitting multiple sclerosis  Other drug-induced neutropenia - Plan: CBC with Differential/Platelet  High risk medication use - Plan: IgG, IgA, IgM, CBC with Differential/Platelet  Right optic neuropathy  Paresthesia - Plan: MR BRAIN W WO CONTRAST, MR CERVICAL SPINE W WO CONTRAST  1.   We will continue Ocrevus.  His MS has been stable while on it.  No exacerbations.. Has recent CBC with differential, IgG/IgM.  Check another MRI of the brain to determine if there is any subclinical progression and consider different disease modifying therapy if this is occurring..   2.   Stay active and exercise as tolerated.   3.   If tight dysesthetic sensory issues worsen, increase gabapentin  or add lamotrigine 4.   Stay active and exercise as tolerated.   5.   He will return to see me in 9 months (to synchronize closer to infusion) or sooner for new or worsening neurologic symptoms.    Albert Edwards A. Vear, MD, [facility] 07/24/2024, 12:38 PM Certified in Neurology, Clinical Neurophysiology, Sleep Medicine and Neuroimaging  [facility] [address] [place], [place] [address] [phone]

## 2024-05-20 ENCOUNTER — Ambulatory Visit: Payer: Self-pay | Admitting: Neurology

## 2024-05-20 ENCOUNTER — Encounter: Payer: Self-pay | Admitting: Oncology

## 2024-05-20 LAB — CBC WITH DIFFERENTIAL/PLATELET
Basophils Absolute: 0 x10E3/uL (ref 0.0–0.2)
Basos: 1 %
EOS (ABSOLUTE): 0.1 x10E3/uL (ref 0.0–0.4)
Eos: 3 %
Hematocrit: 49.8 % (ref 37.5–51.0)
Hemoglobin: 16.4 g/dL (ref 13.0–17.7)
Immature Grans (Abs): 0 x10E3/uL (ref 0.0–0.1)
Immature Granulocytes: 0 %
Lymphocytes Absolute: 0.9 x10E3/uL (ref 0.7–3.1)
Lymphs: 20 %
MCH: 29.3 pg (ref 26.6–33.0)
MCHC: 32.9 g/dL (ref 31.5–35.7)
MCV: 89 fL (ref 79–97)
Monocytes Absolute: 0.5 x10E3/uL (ref 0.1–0.9)
Monocytes: 12 %
Neutrophils Absolute: 2.8 x10E3/uL (ref 1.4–7.0)
Neutrophils: 64 %
Platelets: 199 x10E3/uL (ref 150–450)
RBC: 5.59 x10E6/uL (ref 4.14–5.80)
RDW: 13 % (ref 11.6–15.4)
WBC: 4.4 x10E3/uL (ref 3.4–10.8)

## 2024-05-20 LAB — IGG, IGA, IGM
IgA/Immunoglobulin A, Serum: 102 mg/dL (ref 90–386)
IgG (Immunoglobin G), Serum: 759 mg/dL (ref 603–1613)
IgM (Immunoglobulin M), Srm: 33 mg/dL (ref 20–172)

## 2024-05-20 NOTE — Telephone Encounter (Signed)
 UMR no auth required Decision ID: [id number] sent to [facility] in [place] [phone]

## 2024-05-26 ENCOUNTER — Other Ambulatory Visit: Payer: Self-pay | Admitting: Neurology

## 2024-05-26 ENCOUNTER — Encounter: Payer: Self-pay | Admitting: Neurology

## 2024-05-26 MED ORDER — GABAPENTIN 100 MG PO CAPS: 300.0000 mg | ORAL_CAPSULE | Freq: Three times a day (TID) | ORAL | 11 refills | Status: AC

## 2024-06-11 ENCOUNTER — Encounter: Payer: Self-pay | Admitting: Neurology

## 2024-06-20 NOTE — Telephone Encounter (Signed)
 Pt's son dropped off MRI disc from [facility] and I placed it in Debra's office

## 2024-06-27 ENCOUNTER — Encounter: Payer: Self-pay | Admitting: Neurology

## 2024-07-01 ENCOUNTER — Telehealth: Payer: Self-pay | Admitting: *Deleted

## 2024-07-01 NOTE — Telephone Encounter (Signed)
 R/c cd from [facility]. Cd in nurse pod.

## 2024-07-24 ENCOUNTER — Telehealth: Payer: Self-pay | Admitting: *Deleted

## 2024-07-24 ENCOUNTER — Encounter: Payer: Self-pay | Admitting: Neurology

## 2024-07-24 NOTE — Telephone Encounter (Signed)
 Dr. Vear- can you update last OV note to include RRMS in list of dx? It is mentioned in note but not in dx list specifically. Only has MS. Thank you!

## 2024-08-18 ENCOUNTER — Encounter: Payer: Self-pay | Admitting: Neurology

## 2024-08-19 ENCOUNTER — Other Ambulatory Visit: Payer: Self-pay | Admitting: Neurology

## 2024-08-19 DIAGNOSIS — G35A Relapsing-remitting multiple sclerosis: Secondary | ICD-10-CM

## 2024-08-19 DIAGNOSIS — Z79899 Other long term (current) drug therapy: Secondary | ICD-10-CM

## 2024-09-15 ENCOUNTER — Other Ambulatory Visit: Payer: Self-pay | Admitting: Neurology

## 2024-09-15 ENCOUNTER — Encounter: Payer: Self-pay | Admitting: Neurology

## 2024-09-15 DIAGNOSIS — G35A Relapsing-remitting multiple sclerosis: Secondary | ICD-10-CM

## 2024-09-15 DIAGNOSIS — Z79899 Other long term (current) drug therapy: Secondary | ICD-10-CM

## 2024-09-17 ENCOUNTER — Other Ambulatory Visit: Payer: Self-pay | Admitting: Neurology

## 2024-09-17 ENCOUNTER — Ambulatory Visit: Payer: Self-pay | Admitting: Neurology

## 2024-09-17 DIAGNOSIS — Z79899 Other long term (current) drug therapy: Secondary | ICD-10-CM

## 2024-09-17 DIAGNOSIS — G35A Relapsing-remitting multiple sclerosis: Secondary | ICD-10-CM

## 2024-09-17 LAB — HEPATITIS B CORE ANTIBODY, TOTAL: Hep B Core Total Ab: NEGATIVE

## 2024-09-17 LAB — HEPATITIS B SURFACE ANTIGEN: Hepatitis B Surface Ag: NEGATIVE

## 2024-09-17 LAB — VARICELLA ZOSTER ANTIBODY, IGG: Varicella zoster IgG: REACTIVE

## 2025-02-23 ENCOUNTER — Ambulatory Visit: Admitting: Neurology
